# Patient Record
Sex: Male | Born: 1995 | Race: White | Hispanic: No | Marital: Single | Smoking: Never smoker
Health system: Southern US, Community
[De-identification: ages and names within clinical notes are randomized; demographics above are authoritative.]

## PROBLEM LIST (undated history)

## (undated) DIAGNOSIS — T4145XA Adverse effect of unspecified anesthetic, initial encounter: Secondary | ICD-10-CM

## (undated) DIAGNOSIS — F329 Major depressive disorder, single episode, unspecified: Secondary | ICD-10-CM

## (undated) DIAGNOSIS — J329 Chronic sinusitis, unspecified: Secondary | ICD-10-CM

## (undated) DIAGNOSIS — L709 Acne, unspecified: Secondary | ICD-10-CM

## (undated) DIAGNOSIS — Z8489 Family history of other specified conditions: Secondary | ICD-10-CM

## (undated) DIAGNOSIS — D229 Melanocytic nevi, unspecified: Secondary | ICD-10-CM

## (undated) DIAGNOSIS — T8859XA Other complications of anesthesia, initial encounter: Secondary | ICD-10-CM

## (undated) HISTORY — PX: TYMPANOSTOMY TUBE PLACEMENT: SHX32

## (undated) HISTORY — DX: Melanocytic nevi, unspecified: D22.9

## (undated) HISTORY — PX: CYST EXCISION: SHX5701

## (undated) HISTORY — DX: Major depressive disorder, single episode, unspecified: F32.9

## (undated) HISTORY — PX: WISDOM TOOTH EXTRACTION: SHX21

---
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
## 2016-01-03 DIAGNOSIS — B079 Viral wart, unspecified: Secondary | ICD-10-CM | POA: Diagnosis not present

## 2016-02-22 DIAGNOSIS — R509 Fever, unspecified: Secondary | ICD-10-CM | POA: Diagnosis not present

## 2016-02-22 DIAGNOSIS — R51 Headache: Secondary | ICD-10-CM | POA: Diagnosis not present

## 2016-04-06 MED FILL — ADAPALENE-BNZYL PEROX 0.1-2: 0.1-2.5 | 30 days supply | Qty: 45 | Fill #0

## 2016-05-07 DIAGNOSIS — Z23 Encounter for immunization: Secondary | ICD-10-CM | POA: Diagnosis not present

## 2016-08-07 MED FILL — ADAPALENE-BNZYL PEROX 0.1-2: 0.1-2.5 | 30 days supply | Qty: 45 | Fill #1

## 2017-01-04 DIAGNOSIS — Z0000 Encounter for general adult medical examination without abnormal findings: Secondary | ICD-10-CM | POA: Diagnosis not present

## 2017-03-13 MED FILL — EPIDUO FORTE 0.3-2.5% GEL P: 0.3-2.5 | 30 days supply | Qty: 45 | Fill #0

## 2017-06-29 DIAGNOSIS — J329 Chronic sinusitis, unspecified: Secondary | ICD-10-CM

## 2017-06-29 HISTORY — DX: Chronic sinusitis, unspecified: J32.9

## 2017-07-24 ENCOUNTER — Other Ambulatory Visit (HOSPITAL_COMMUNITY): Payer: Self-pay | Admitting: Otolaryngology

## 2017-07-24 DIAGNOSIS — R220 Localized swelling, mass and lump, head: Secondary | ICD-10-CM | POA: Diagnosis not present

## 2017-07-24 DIAGNOSIS — J3089 Other allergic rhinitis: Secondary | ICD-10-CM | POA: Diagnosis not present

## 2017-07-24 DIAGNOSIS — J3489 Other specified disorders of nose and nasal sinuses: Secondary | ICD-10-CM

## 2017-07-24 MED FILL — EPIDUO FORTE 0.3-2.5% GEL P: 0.3-2.5 | 30 days supply | Qty: 45 | Fill #1

## 2017-07-25 ENCOUNTER — Other Ambulatory Visit (HOSPITAL_COMMUNITY): Payer: Self-pay | Admitting: Otolaryngology

## 2017-07-25 ENCOUNTER — Ambulatory Visit (HOSPITAL_COMMUNITY)
Admission: RE | Admit: 2017-07-25 | Discharge: 2017-07-25 | Disposition: A | Discharge status: Home / Self Care | Payer: 59 | Source: Ambulatory Visit | Attending: Otolaryngology | Admitting: Otolaryngology

## 2017-07-25 DIAGNOSIS — J3489 Other specified disorders of nose and nasal sinuses: Secondary | ICD-10-CM

## 2017-07-25 DIAGNOSIS — R220 Localized swelling, mass and lump, head: Principal | ICD-10-CM

## 2017-07-29 ENCOUNTER — Encounter (HOSPITAL_BASED_OUTPATIENT_CLINIC_OR_DEPARTMENT_OTHER): Payer: Self-pay | Admitting: *Deleted

## 2017-07-29 ENCOUNTER — Other Ambulatory Visit: Payer: Self-pay | Admitting: Otolaryngology

## 2017-07-29 ENCOUNTER — Other Ambulatory Visit: Payer: Self-pay

## 2017-07-31 ENCOUNTER — Ambulatory Visit (HOSPITAL_COMMUNITY): Payer: Self-pay

## 2017-08-02 ENCOUNTER — Encounter (HOSPITAL_BASED_OUTPATIENT_CLINIC_OR_DEPARTMENT_OTHER): Payer: Self-pay | Admitting: *Deleted

## 2017-08-02 ENCOUNTER — Ambulatory Visit (HOSPITAL_BASED_OUTPATIENT_CLINIC_OR_DEPARTMENT_OTHER): Payer: 59 | Admitting: Certified Registered"

## 2017-08-02 ENCOUNTER — Other Ambulatory Visit: Payer: Self-pay

## 2017-08-02 ENCOUNTER — Ambulatory Visit (HOSPITAL_BASED_OUTPATIENT_CLINIC_OR_DEPARTMENT_OTHER)
Admission: RE | Admit: 2017-08-02 | Discharge: 2017-08-02 | Disposition: A | Discharge status: Home / Self Care | Payer: 59 | Source: Ambulatory Visit | Attending: Otolaryngology | Admitting: Otolaryngology

## 2017-08-02 ENCOUNTER — Encounter (HOSPITAL_BASED_OUTPATIENT_CLINIC_OR_DEPARTMENT_OTHER)
Admission: RE | Disposition: A | Discharge status: Home / Self Care | Payer: Self-pay | Source: Ambulatory Visit | Attending: Otolaryngology

## 2017-08-02 DIAGNOSIS — L709 Acne, unspecified: Secondary | ICD-10-CM | POA: Insufficient documentation

## 2017-08-02 DIAGNOSIS — M2740 Unspecified cyst of jaw: Secondary | ICD-10-CM | POA: Diagnosis not present

## 2017-08-02 DIAGNOSIS — Z882 Allergy status to sulfonamides status: Secondary | ICD-10-CM | POA: Diagnosis not present

## 2017-08-02 DIAGNOSIS — J322 Chronic ethmoidal sinusitis: Secondary | ICD-10-CM | POA: Diagnosis not present

## 2017-08-02 DIAGNOSIS — J329 Chronic sinusitis, unspecified: Secondary | ICD-10-CM | POA: Diagnosis not present

## 2017-08-02 DIAGNOSIS — J341 Cyst and mucocele of nose and nasal sinus: Secondary | ICD-10-CM | POA: Insufficient documentation

## 2017-08-02 HISTORY — DX: Adverse effect of unspecified anesthetic, initial encounter: T41.45XA

## 2017-08-02 HISTORY — DX: Other complications of anesthesia, initial encounter: T88.59XA

## 2017-08-02 HISTORY — DX: Acne, unspecified: L70.9

## 2017-08-02 HISTORY — DX: Chronic sinusitis, unspecified: J32.9

## 2017-08-02 HISTORY — DX: Family history of other specified conditions: Z84.89

## 2017-08-02 HISTORY — PX: SINUS ENDO W/FUSION: SHX777

## 2017-08-02 SURGERY — SINUS SURGERY, ENDOSCOPIC, USING COMPUTER-ASSISTED NAVIGATION
Anesthesia: General | Site: Nose | Laterality: Bilateral

## 2017-08-02 MED ORDER — PROMETHAZINE HCL 25 MG/ML IJ SOLN: 6.2500 mg | INTRAMUSCULAR | Status: DC | PRN

## 2017-08-02 MED ORDER — LIDOCAINE-EPINEPHRINE 1 %-1:100000 IJ SOLN
INTRAMUSCULAR | Status: AC
  Filled 2017-08-02: qty 1

## 2017-08-02 MED ORDER — ONDANSETRON HCL 4 MG/2ML IJ SOLN
INTRAMUSCULAR | Status: DC | PRN
  Administered 2017-08-02: 4 mg via INTRAVENOUS

## 2017-08-02 MED ORDER — PROPOFOL 10 MG/ML IV BOLUS
INTRAVENOUS | Status: AC
  Filled 2017-08-02: qty 20

## 2017-08-02 MED ORDER — SUGAMMADEX SODIUM 200 MG/2ML IV SOLN
INTRAVENOUS | Status: DC | PRN
  Administered 2017-08-02: 200 mg via INTRAVENOUS

## 2017-08-02 MED ORDER — OXYCODONE HCL 5 MG PO TABS
5.0000 mg | ORAL_TABLET | Freq: Once | ORAL | Status: AC | PRN
  Administered 2017-08-02: 5 mg via ORAL

## 2017-08-02 MED ORDER — SCOPOLAMINE 1 MG/3DAYS TD PT72: 1.0000 | MEDICATED_PATCH | Freq: Once | TRANSDERMAL | Status: DC | PRN

## 2017-08-02 MED ORDER — SUGAMMADEX SODIUM 200 MG/2ML IV SOLN
INTRAVENOUS | Status: AC
  Filled 2017-08-02: qty 2

## 2017-08-02 MED ORDER — BACITRACIN ZINC 500 UNIT/GM EX OINT
TOPICAL_OINTMENT | CUTANEOUS | Status: DC | PRN
  Administered 2017-08-02: 1 via TOPICAL

## 2017-08-02 MED ORDER — PROPOFOL 10 MG/ML IV BOLUS
INTRAVENOUS | Status: DC | PRN
  Administered 2017-08-02: 200 mg via INTRAVENOUS

## 2017-08-02 MED ORDER — FENTANYL CITRATE (PF) 100 MCG/2ML IJ SOLN: 50.0000 ug | INTRAMUSCULAR | Status: DC | PRN

## 2017-08-02 MED ORDER — PHENYLEPHRINE 40 MCG/ML (10ML) SYRINGE FOR IV PUSH (FOR BLOOD PRESSURE SUPPORT)
PREFILLED_SYRINGE | INTRAVENOUS | Status: AC
  Filled 2017-08-02: qty 10

## 2017-08-02 MED ORDER — MIDAZOLAM HCL 5 MG/5ML IJ SOLN
INTRAMUSCULAR | Status: DC | PRN
  Administered 2017-08-02: 2 mg via INTRAVENOUS

## 2017-08-02 MED ORDER — FENTANYL CITRATE (PF) 100 MCG/2ML IJ SOLN
INTRAMUSCULAR | Status: AC
  Filled 2017-08-02: qty 4

## 2017-08-02 MED ORDER — LACTATED RINGERS IV SOLN
INTRAVENOUS | Status: DC
  Administered 2017-08-02 (×2): via INTRAVENOUS

## 2017-08-02 MED ORDER — DEXAMETHASONE SODIUM PHOSPHATE 10 MG/ML IJ SOLN
INTRAMUSCULAR | Status: AC
  Filled 2017-08-02: qty 1

## 2017-08-02 MED ORDER — MIDAZOLAM HCL 2 MG/2ML IJ SOLN
INTRAMUSCULAR | Status: AC
  Filled 2017-08-02: qty 2

## 2017-08-02 MED ORDER — BACITRACIN ZINC 500 UNIT/GM EX OINT
TOPICAL_OINTMENT | CUTANEOUS | Status: AC
  Filled 2017-08-02: qty 28.35

## 2017-08-02 MED ORDER — MIDAZOLAM HCL 2 MG/2ML IJ SOLN: 1.0000 mg | INTRAMUSCULAR | Status: DC | PRN

## 2017-08-02 MED ORDER — PHENYLEPHRINE HCL 10 MG/ML IJ SOLN
INTRAMUSCULAR | Status: DC | PRN
  Administered 2017-08-02: 80 ug via INTRAVENOUS

## 2017-08-02 MED ORDER — ONDANSETRON HCL 4 MG/2ML IJ SOLN
INTRAMUSCULAR | Status: AC
  Filled 2017-08-02: qty 2

## 2017-08-02 MED ORDER — CEPHALEXIN 500 MG PO CAPS: 500.0000 mg | ORAL_CAPSULE | Freq: Three times a day (TID) | ORAL | 0 refills | Status: DC

## 2017-08-02 MED ORDER — ROCURONIUM BROMIDE 10 MG/ML (PF) SYRINGE
PREFILLED_SYRINGE | INTRAVENOUS | Status: AC
  Filled 2017-08-02: qty 5

## 2017-08-02 MED ORDER — LIDOCAINE HCL (CARDIAC) 20 MG/ML IV SOLN
INTRAVENOUS | Status: DC | PRN
  Administered 2017-08-02: 100 mg via INTRAVENOUS

## 2017-08-02 MED ORDER — DEXAMETHASONE SODIUM PHOSPHATE 10 MG/ML IJ SOLN
INTRAMUSCULAR | Status: DC | PRN
  Administered 2017-08-02: 10 mg via INTRAVENOUS

## 2017-08-02 MED ORDER — EPHEDRINE 5 MG/ML INJ
INTRAVENOUS | Status: AC
  Filled 2017-08-02: qty 10

## 2017-08-02 MED ORDER — FENTANYL CITRATE (PF) 100 MCG/2ML IJ SOLN: 25.0000 ug | INTRAMUSCULAR | Status: DC | PRN

## 2017-08-02 MED ORDER — HYDROCODONE-ACETAMINOPHEN 5-325 MG PO TABS: 1.0000 | ORAL_TABLET | Freq: Four times a day (QID) | ORAL | 0 refills | Status: DC | PRN

## 2017-08-02 MED ORDER — OXYCODONE HCL 5 MG/5ML PO SOLN: 5.0000 mg | Freq: Once | ORAL | Status: AC | PRN

## 2017-08-02 MED ORDER — ROCURONIUM BROMIDE 100 MG/10ML IV SOLN
INTRAVENOUS | Status: DC | PRN
  Administered 2017-08-02: 50 mg via INTRAVENOUS
  Administered 2017-08-02: 20 mg via INTRAVENOUS

## 2017-08-02 MED ORDER — LIDOCAINE-EPINEPHRINE 1 %-1:100000 IJ SOLN
INTRAMUSCULAR | Status: DC | PRN
  Administered 2017-08-02: 7 mL

## 2017-08-02 MED ORDER — FENTANYL CITRATE (PF) 100 MCG/2ML IJ SOLN
INTRAMUSCULAR | Status: DC | PRN
  Administered 2017-08-02: 50 ug via INTRAVENOUS
  Administered 2017-08-02: 100 ug via INTRAVENOUS

## 2017-08-02 MED ORDER — OXYMETAZOLINE HCL 0.05 % NA SOLN
NASAL | Status: DC | PRN
  Administered 2017-08-02: 1

## 2017-08-02 MED ORDER — OXYCODONE HCL 5 MG PO TABS
ORAL_TABLET | ORAL | Status: AC
  Filled 2017-08-02: qty 1

## 2017-08-02 MED ORDER — LIDOCAINE 2% (20 MG/ML) 5 ML SYRINGE
INTRAMUSCULAR | Status: AC
  Filled 2017-08-02: qty 10

## 2017-08-02 MED ORDER — OXYMETAZOLINE HCL 0.05 % NA SOLN
NASAL | Status: AC
  Filled 2017-08-02: qty 15

## 2017-08-02 MED FILL — HYDROCODON-APAP 5-325: 5-325 | 8 days supply | Qty: 30 | Fill #0

## 2017-08-02 MED FILL — CEPHALEXIN 500 MG CAPSULE: 500 | 5 days supply | Qty: 15 | Fill #0

## 2017-08-02 SURGICAL SUPPLY — 52 items
ATTRACTOMAT 16X20 MAGNETIC DRP (DRAPES) IMPLANT
BLADE RAD40 ROTATE 4M 4 5PK (BLADE) IMPLANT
BLADE RAD60 ROTATE M4 4 5PK (BLADE) IMPLANT
BLADE ROTATE RAD 12 4 M4 (BLADE) IMPLANT
BLADE ROTATE RAD 40 4 M4 (BLADE) IMPLANT
BLADE ROTATE TRICUT 4X13 M4 (BLADE) ×2 IMPLANT
BLADE TRICUT ROTATE M4 4 5PK (BLADE) IMPLANT
BUR HS RAD FRONTAL 3 (BURR) IMPLANT
CANISTER SUC SOCK COL 7IN (MISCELLANEOUS) ×3 IMPLANT
CANISTER SUCT 1200ML W/VALVE (MISCELLANEOUS) ×2 IMPLANT
COAGULATOR SUCT SWTCH 10FR 6 (ELECTROSURGICAL) IMPLANT
DECANTER SPIKE VIAL GLASS SM (MISCELLANEOUS) ×2 IMPLANT
DRAPE SURG 17X23 STRL (DRAPES) IMPLANT
DRESSING NASAL KENNEDY 3.5X.9 (MISCELLANEOUS) IMPLANT
DRSG NASAL KENNEDY 3.5X.9 (MISCELLANEOUS)
DRSG NASOPORE 8CM (GAUZE/BANDAGES/DRESSINGS) ×1 IMPLANT
DRSG TELFA 3X8 NADH (GAUZE/BANDAGES/DRESSINGS) IMPLANT
ELECT COATED BLADE 2.86 ST (ELECTRODE) IMPLANT
ELECT REM PT RETURN 9FT ADLT (ELECTROSURGICAL) ×2
ELECTRODE REM PT RTRN 9FT ADLT (ELECTROSURGICAL) ×1 IMPLANT
GLOVE SS BIOGEL STRL SZ 7.5 (GLOVE) ×1 IMPLANT
GLOVE SUPERSENSE BIOGEL SZ 7.5 (GLOVE) ×1
GLOVE SURG SS PI 7.0 STRL IVOR (GLOVE) ×1 IMPLANT
GOWN STRL REUS W/ TWL LRG LVL3 (GOWN DISPOSABLE) ×2 IMPLANT
GOWN STRL REUS W/TWL LRG LVL3 (GOWN DISPOSABLE) ×4
IV NS 1000ML (IV SOLUTION)
IV NS 1000ML BAXH (IV SOLUTION) IMPLANT
IV NS 500ML (IV SOLUTION) ×2
IV NS 500ML BAXH (IV SOLUTION) ×1 IMPLANT
NDL PRECISIONGLIDE 27X1.5 (NEEDLE) ×1 IMPLANT
NDL SPNL 25GX3.5 QUINCKE BL (NEEDLE) IMPLANT
NEEDLE PRECISIONGLIDE 27X1.5 (NEEDLE) ×2 IMPLANT
NEEDLE SPNL 25GX3.5 QUINCKE BL (NEEDLE) IMPLANT
NS IRRIG 1000ML POUR BTL (IV SOLUTION) ×2 IMPLANT
PACK BASIN DAY SURGERY FS (CUSTOM PROCEDURE TRAY) ×2 IMPLANT
PACK ENT DAY SURGERY (CUSTOM PROCEDURE TRAY) ×2 IMPLANT
PAD DRESSING TELFA 3X8 NADH (GAUZE/BANDAGES/DRESSINGS) IMPLANT
PATTIES SURGICAL .5 X3 (DISPOSABLE) ×2 IMPLANT
PENCIL FOOT CONTROL (ELECTRODE) IMPLANT
SLEEVE SCD COMPRESS KNEE MED (MISCELLANEOUS) ×2 IMPLANT
SOLUTION ANTI FOG 6CC (MISCELLANEOUS) ×2 IMPLANT
SPONGE GAUZE 2X2 8PLY STRL LF (GAUZE/BANDAGES/DRESSINGS) ×2 IMPLANT
SPONGE SURGIFOAM ABS GEL 12-7 (HEMOSTASIS) IMPLANT
SUT CHROMIC 3 0 PS 2 (SUTURE) IMPLANT
SUT ETHILON 3 0 PS 1 (SUTURE) IMPLANT
TOWEL OR 17X24 6PK STRL BLUE (TOWEL DISPOSABLE) ×2 IMPLANT
TRACKER ENT INSTRUMENT (MISCELLANEOUS) ×2 IMPLANT
TRACKER ENT PATIENT (MISCELLANEOUS) ×2 IMPLANT
TRAY DSU PREP LF (CUSTOM PROCEDURE TRAY) ×2 IMPLANT
TUBE CONNECTING 20X1/4 (TUBING) ×2 IMPLANT
TUBING STRAIGHTSHOT EPS 5PK (TUBING) ×2 IMPLANT
YANKAUER SUCT BULB TIP NO VENT (SUCTIONS) ×2 IMPLANT

## 2017-08-02 NOTE — Op Note (Signed)
Preop/postop diagnosis: Maxillary cysts/masses Procedure: Bilateral max or antrostomy with stripping and anterior ethmoidectomy with fusion guidance Anesthesia: Gen. Estimated blood loss: Less than 10 mL Indications: 22 year old with previous masses in his maxillary sinuses that have enlarged and followed by dentistry with x-rays. They now want to have them removed as they're concerned. CT scan indicates a 2-1/2 cm mass in the right maxillary sinus smaller on the left. We discussed the endoscopic sinus surgery. We discussed risks, benefits, and options. All their questions are answered and consent was obtained. Procedure: Patient was taken to the operating room placed in the supine position after general endotracheal tube anesthesia was placed in the supine position prepped and draped in usual sterile manner. The fusion computer guidance system was positioned calibrated with excellent accuracy. The oxymetazoline pledgets were placed in the nose bilaterally and the inferior and middle turbinates were injected with 1% lidocaine with 1 100,000 epinephrine. The left side was begun making a uncinectomy using the microdebrider and fusion guidance and the uncinate was removed up to the attachment of middle turbinate and down opening up the antrostomy widely. The curved suction was used to rupture the cyst and removed through suction on the left side. The mucosa lining looked good. Anterior ethmoid was opened with the microdebrider and fusion guidance. There was just mild thickened mucosa. A pledget was placed. Right side was then repeated in the same fashion with uncinate removed and antrostomy opened widely. This was done with the microdebrider. The cyst was then visualized with the 30 scope and opened. There was a clear serous fluid. An alligator forcep was used to remove the cyst and mucosa. Microdebrider was then used to open the anterior ethmoid which had slight thickening of the tissue. Pledget was placed. The  nasopharynx was suctioned out of all blood and debris. Nasal pore soaked in Bactroban was placed into the nose ethmoid cavities bilaterally. There is good hemostasis. The patient was awakened brought to recovery in stable condition counts correct

## 2017-08-02 NOTE — H&P (Signed)
Richard Dominguez is an 22 y.o. male.   Chief Complaint: sinusitis HPI: hx of cyst/mass in the sinus and ready to remove  Past Medical History:  Diagnosis Date  . Acne   . Chronic sinusitis 06/2017  . Complication of anesthesia    was told that he became "obtunded" when his neck was hyperextended during wisdom teeth extraction, per mother  . Family history of adverse reaction to anesthesia    pt's father wakes up angry    Past Surgical History:  Procedure Laterality Date  . CYST EXCISION     lower lip  . TYMPANOSTOMY TUBE PLACEMENT Bilateral   . WISDOM TOOTH EXTRACTION      Family History  Problem Relation Age of Onset  . Anesthesia problems Father        wakes up angry   Social History:  reports that  has never smoked. he has never used smokeless tobacco. He reports that he drinks alcohol. He reports that he does not use drugs.  Allergies:  Allergies  Allergen Reactions  . Sulfa Antibiotics Rash    Medications Prior to Admission  Medication Sig Dispense Refill  . Adapalene-Benzoyl Peroxide (EPIDUO) 0.1-2.5 % gel Apply topically at bedtime.      No results found for this or any previous visit (from the past 48 hour(s)). No results found.  Review of Systems  Constitutional: Negative.   HENT: Negative.   Eyes: Negative.   Respiratory: Negative.   Cardiovascular: Negative.   Skin: Negative.     Blood pressure 118/71, pulse 63, temperature (!) 97.5 F (36.4 C), temperature source Oral, resp. rate 15, height 6\' 4"  (1.93 m), weight 63.9 kg (140 lb 12.8 oz), SpO2 100 %. Physical Exam  Constitutional: He appears well-developed and well-nourished.  HENT:  Head: Normocephalic and atraumatic.  Mouth/Throat: Oropharynx is clear and moist.  Eyes: Conjunctivae are normal. Pupils are equal, round, and reactive to light.  Neck: Normal range of motion. Neck supple.  Cardiovascular: Normal rate.  Respiratory: Effort normal.  GI: Soft.  Musculoskeletal: Normal range of  motion.     Assessment/Plan Chronic sinusitis- discussed ESS and ready to proceed  Melissa Montane, MD 08/02/2017, 10:39 AM

## 2017-08-02 NOTE — Transfer of Care (Signed)
Immediate Anesthesia Transfer of Care Note  Patient: Richard Dominguez  Procedure(s) Performed: Endoscopic sinus surgery with maxillary antrostomy with stripping and anterior ethmoidectomy (Bilateral Nose)  Patient Location: PACU  Anesthesia Type:General  Level of Consciousness: awake, alert  and oriented  Airway & Oxygen Therapy: Patient Spontanous Breathing and Patient connected to face mask oxygen  Post-op Assessment: Report given to RN and Post -op Vital signs reviewed and stable  Post vital signs: Reviewed and stable  Last Vitals:  Vitals:   08/02/17 0944  BP: 118/71  Pulse: 63  Resp: 15  Temp: (!) 36.4 C  SpO2: 100%    Last Pain:  Vitals:   08/02/17 0944  TempSrc: Oral         Complications: No apparent anesthesia complications

## 2017-08-02 NOTE — Discharge Instructions (Signed)

## 2017-08-02 NOTE — Anesthesia Postprocedure Evaluation (Signed)
Anesthesia Post Note  Patient: Richard Dominguez  Procedure(s) Performed: Endoscopic sinus surgery with maxillary antrostomy with stripping and anterior ethmoidectomy (Bilateral Nose)     Patient location during evaluation: PACU Anesthesia Type: General Level of consciousness: awake and alert Pain management: pain level controlled Vital Signs Assessment: post-procedure vital signs reviewed and stable Respiratory status: spontaneous breathing, nonlabored ventilation and respiratory function stable Cardiovascular status: blood pressure returned to baseline and stable Postop Assessment: no apparent nausea or vomiting Anesthetic complications: no    Last Vitals:  Vitals:   08/02/17 1215 08/02/17 1230  BP: 123/79 130/86  Pulse: 72 81  Resp: 10 13  Temp:    SpO2: 99% 98%    Last Pain:  Vitals:   08/02/17 1230  TempSrc:   PainSc: 2                  Audry Pili

## 2017-08-02 NOTE — Anesthesia Preprocedure Evaluation (Signed)
Anesthesia Evaluation  Patient identified by MRN, date of birth, ID band Patient awake    Reviewed: Allergy & Precautions, NPO status , Patient's Chart, lab work & pertinent test results  Airway Mallampati: II  TM Distance: >3 FB Neck ROM: Full    Dental  (+) Dental Advisory Given   Pulmonary neg pulmonary ROS,    Pulmonary exam normal breath sounds clear to auscultation       Cardiovascular negative cardio ROS Normal cardiovascular exam Rhythm:Regular Rate:Normal     Neuro/Psych negative neurological ROS  negative psych ROS   GI/Hepatic negative GI ROS, Neg liver ROS,   Endo/Other  negative endocrine ROS  Renal/GU negative Renal ROS  negative genitourinary   Musculoskeletal negative musculoskeletal ROS (+)   Abdominal   Peds  Hematology negative hematology ROS (+)   Anesthesia Other Findings   Reproductive/Obstetrics                             Anesthesia Physical Anesthesia Plan  ASA: I  Anesthesia Plan: General   Post-op Pain Management:    Induction: Intravenous  PONV Risk Score and Plan: 3 and Treatment may vary due to age or medical condition, Ondansetron, Dexamethasone and Midazolam  Airway Management Planned: Oral ETT  Additional Equipment: None  Intra-op Plan:   Post-operative Plan: Extubation in OR  Informed Consent: I have reviewed the patients History and Physical, chart, labs and discussed the procedure including the risks, benefits and alternatives for the proposed anesthesia with the patient or authorized representative who has indicated his/her understanding and acceptance.   Dental advisory given  Plan Discussed with: CRNA  Anesthesia Plan Comments:         Anesthesia Quick Evaluation

## 2017-08-05 ENCOUNTER — Encounter (HOSPITAL_BASED_OUTPATIENT_CLINIC_OR_DEPARTMENT_OTHER): Payer: Self-pay | Admitting: Otolaryngology

## 2017-08-09 DIAGNOSIS — J324 Chronic pansinusitis: Secondary | ICD-10-CM | POA: Insufficient documentation

## 2017-08-16 DIAGNOSIS — J324 Chronic pansinusitis: Secondary | ICD-10-CM | POA: Diagnosis not present

## 2017-09-20 MED FILL — EPIDUO FORTE 0.3-2.5% GEL P: 0.3-2.5 | 30 days supply | Qty: 45 | Fill #2

## 2017-11-13 MED FILL — EPIDUO FORTE 0.3-2.5% GEL P: 0.3-2.5 | 30 days supply | Qty: 45 | Fill #3

## 2017-11-23 DIAGNOSIS — R51 Headache: Secondary | ICD-10-CM | POA: Diagnosis not present

## 2017-11-23 DIAGNOSIS — R509 Fever, unspecified: Secondary | ICD-10-CM | POA: Diagnosis not present

## 2017-11-23 DIAGNOSIS — R52 Pain, unspecified: Secondary | ICD-10-CM | POA: Diagnosis not present

## 2017-11-23 DIAGNOSIS — R05 Cough: Secondary | ICD-10-CM | POA: Diagnosis not present

## 2017-11-23 DIAGNOSIS — M791 Myalgia, unspecified site: Secondary | ICD-10-CM | POA: Diagnosis not present

## 2017-11-24 DIAGNOSIS — R509 Fever, unspecified: Secondary | ICD-10-CM | POA: Diagnosis not present

## 2017-12-06 DIAGNOSIS — H6123 Impacted cerumen, bilateral: Secondary | ICD-10-CM | POA: Insufficient documentation

## 2017-12-19 ENCOUNTER — Ambulatory Visit (INDEPENDENT_AMBULATORY_CARE_PROVIDER_SITE_OTHER): Payer: Self-pay | Admitting: Nurse Practitioner

## 2017-12-19 VITALS — BP 118/78 | HR 79 | Temp 98.3°F | Resp 20 | Ht 77.5 in | Wt 144.6 lb

## 2017-12-19 DIAGNOSIS — Z0000 Encounter for general adult medical examination without abnormal findings: Secondary | ICD-10-CM

## 2017-12-19 NOTE — Patient Instructions (Signed)

## 2017-12-19 NOTE — Progress Notes (Signed)
Subjective:  Richard Dominguez is a 22 y.o. male who presents for basic physical exam for summer camp.  Patient denies any current health related concerns.  Patient does have a history of allergies to sulfa medications.  Patient denies any other medical history other than asthma and chronic sinusitis.  Patient takes Zyrtec as needed for seasonal allergies.  Patient's  immunizations are up-to-date with the exception of the second dose of the meningococcal B vaccine.  Past Medical History:  Diagnosis Date  . Acne   . Chronic sinusitis 06/2017  . Complication of anesthesia    was told that he became "obtunded" when his neck was hyperextended during wisdom teeth extraction, per mother  . Family history of adverse reaction to anesthesia    pt's father wakes up angry    Past Surgical History:  Procedure Laterality Date  . CYST EXCISION     lower lip  . SINUS ENDO W/FUSION Bilateral 08/02/2017   Procedure: Endoscopic sinus surgery with maxillary antrostomy with stripping and anterior ethmoidectomy;  Surgeon: Melissa Montane, MD;  Location: [facility];  Service: ENT;  Laterality: Bilateral;  Endoscopic sinus surgery with maxillary antrostomy with stripping and anterior ethmoidectomy  . TYMPANOSTOMY TUBE PLACEMENT Bilateral   . WISDOM TOOTH EXTRACTION      Social History   Tobacco Use  . Smoking status: Never Smoker  . Smokeless tobacco: Never Used  Substance Use Topics  . Alcohol use: Yes    Comment: occasionally  . Drug use: No    Allergies  Allergen Reactions  . Sulfa Antibiotics Rash    Current Outpatient Medications  Medication Sig Dispense Refill  . Adapalene-Benzoyl Peroxide (EPIDUO) 0.1-2.5 % gel Apply topically at bedtime.    . cephALEXin (KEFLEX) 500 MG capsule Take 1 capsule (500 mg total) by mouth 3 (three) times daily. (Patient not taking: Reported on 12/19/2017) 15 capsule 0  . HYDROcodone-acetaminophen (LORTAB) 5-325 MG tablet Take 1 tablet by mouth every 6  (six) hours as needed for moderate pain. (Patient not taking: Reported on 12/19/2017) 30 tablet 0   No current facility-administered medications for this visit.     Review of Systems  Constitutional: Negative.   HENT: Negative.   Eyes: Negative.   Respiratory: Negative.   Cardiovascular: Negative.   Gastrointestinal: Negative.   Genitourinary: Negative.   Musculoskeletal: Negative.   Skin: Negative.   Neurological: Negative.   Endo/Heme/Allergies: Positive for environmental allergies.  Psychiatric/Behavioral: Negative.     Objective:  BP 118/78 (BP Location: Right Arm, Patient Position: Sitting, Cuff Size: Normal)   Pulse 79   Temp 98.3 F (36.8 C) (Oral)   Resp 20   Ht 6' 5.5" (1.969 m)   Wt 144 lb 9.6 oz (65.6 kg)   SpO2 96%   BMI 16.93 kg/m   General Appearance:  Alert, cooperative, no distress, appears stated age  Head:  Normocephalic, without obvious abnormality, atraumatic  Eyes:  PERRL, conjunctiva/corneas clear, EOM's intact, fundi benign, both eyes  Ears:  Normal TM's and external ear canals, both ears  Nose: Nares normal, septum midline, mucosa normal, no drainage or sinus tenderness  Throat: Lips, mucosa, and tongue normal; teeth and gums normal  Neck: Supple, symmetrical, trachea midline, no adenopathy, thyroid: not enlarged, symmetric, no tenderness/mass/nodules, no carotid bruit or JVD  Back:   Symmetric, no curvature, ROM normal, no CVA tenderness  Lungs:   Clear to auscultation bilaterally, respirations unlabored  Chest Wall:  No tenderness or deformity  Heart:  Regular  rate and rhythm, S1, S2 normal, no murmur, rub or gallop  Abdomen:   Soft, non-tender, bowel sounds active all four quadrants,  no masses, no organomegaly  Extremities: Extremities normal, atraumatic, no cyanosis or edema  Pulses: 2+ and symmetric  Skin: Skin color, texture, turgor normal, no rashes or lesions  Lymph nodes: Cervical and submandibular nodes normal  Neurologic: Normal       Assessment:  basic physical exam    Plan:  Patient education provided.  No labs needed at this time.  Patient will follow up with PCP.

## 2018-01-01 MED FILL — EPIDUO FORTE 0.3-2.5% GEL P: 0.3-2.5 | 30 days supply | Qty: 45 | Fill #4

## 2018-02-27 MED FILL — EPIDUO FORTE 0.3-2.5% GEL P: 0.3-2.5 | 30 days supply | Qty: 45 | Fill #5

## 2018-04-02 MED FILL — EPIDUO FORTE 0.3-2.5% GEL P: 0.3-2.5 | 30 days supply | Qty: 45 | Fill #0

## 2018-05-03 DIAGNOSIS — N442 Benign cyst of testis: Secondary | ICD-10-CM | POA: Diagnosis not present

## 2018-05-29 MED FILL — EPIDUO FORTE 0.3-2.5% GEL P: 0.3-2.5 | 30 days supply | Qty: 45 | Fill #1

## 2018-06-23 DIAGNOSIS — H6123 Impacted cerumen, bilateral: Secondary | ICD-10-CM | POA: Diagnosis not present

## 2018-06-27 ENCOUNTER — Ambulatory Visit (INDEPENDENT_AMBULATORY_CARE_PROVIDER_SITE_OTHER): Payer: Self-pay | Admitting: Family

## 2018-06-27 DIAGNOSIS — Z23 Encounter for immunization: Secondary | ICD-10-CM

## 2018-06-27 NOTE — Patient Instructions (Signed)

## 2018-07-03 MED FILL — EPIDUO FORTE 0.3-2.5% GEL P: 0.3-2.5 | 30 days supply | Qty: 45 | Fill #2

## 2018-07-15 DIAGNOSIS — L709 Acne, unspecified: Secondary | ICD-10-CM | POA: Diagnosis not present

## 2018-07-15 DIAGNOSIS — L309 Dermatitis, unspecified: Secondary | ICD-10-CM | POA: Diagnosis not present

## 2018-07-15 DIAGNOSIS — D229 Melanocytic nevi, unspecified: Secondary | ICD-10-CM | POA: Diagnosis not present

## 2018-07-17 MED FILL — TRIAMCINOLONE 0.1% CREAM: 0.1 | 30 days supply | Qty: 80 | Fill #0

## 2018-07-18 ENCOUNTER — Ambulatory Visit: Payer: 59 | Admitting: Podiatry

## 2018-07-18 ENCOUNTER — Encounter: Payer: Self-pay | Admitting: Podiatry

## 2018-07-18 DIAGNOSIS — L600 Ingrowing nail: Secondary | ICD-10-CM | POA: Diagnosis not present

## 2018-07-18 MED ORDER — NEOMYCIN-POLYMYXIN-HC 3.5-10000-1 OT SOLN: OTIC | 0 refills | Status: DC

## 2018-07-18 MED FILL — NEO/POLYMYXIN/HC EAR SOLN: 3.5-10000-1 | 30 days supply | Qty: 10 | Fill #0

## 2018-07-18 NOTE — Patient Instructions (Signed)

## 2018-07-19 NOTE — Progress Notes (Signed)
Subjective:   Patient ID: Richard Dominguez, male   DOB: 22 y.o.   MRN: [id number]   HPI Patient states I had ingrown toenails removed a number of years ago which is done well but I developed a spicule on the left big toe that sore and I have to work on it and it times it gets quite irritated and hard to get out   ROS      Objective:  Physical Exam  Neurovascular status intact with patient having good process with chronic ingrown toenail correction several years ago but does have incurvation of the medial border with spicule formation left hallux     Assessment:  Ingrown toenail deformity left hallux that is painful with spicule formation with the right showing irritation but no indication of a spicule formation     Plan:  H&P condition reviewed and recommended removal of the left spicule.  Explained procedure risk patient signed consent form I infiltrated 60 mg like Marcaine mixture removed spicule under sterile conditions with sterile instrumentation and applied phenol to the base 3 applications 30 seconds followed by alcohol lavage and sterile dressing.  Gave instructions on soaks and reappoint and was encouraged to call with questions

## 2018-08-06 MED FILL — EPIDUO FORTE 0.3-2.5% GEL P: 0.3-2.5 | 30 days supply | Qty: 45 | Fill #0

## 2018-09-24 MED FILL — EPIDUO FORTE 0.3-2.5% GEL P: 0.3-2.5 | 30 days supply | Qty: 45 | Fill #1

## 2018-12-30 MED FILL — EPIDUO FORTE 0.3-2.5% GEL P: 0.3-2.5 | 30 days supply | Qty: 45 | Fill #2

## 2019-02-03 DIAGNOSIS — H5203 Hypermetropia, bilateral: Secondary | ICD-10-CM | POA: Diagnosis not present

## 2019-02-11 DIAGNOSIS — H6123 Impacted cerumen, bilateral: Secondary | ICD-10-CM | POA: Diagnosis not present

## 2019-02-20 ENCOUNTER — Other Ambulatory Visit: Payer: Self-pay

## 2019-02-20 DIAGNOSIS — R6889 Other general symptoms and signs: Secondary | ICD-10-CM | POA: Diagnosis not present

## 2019-02-20 DIAGNOSIS — Z20822 Contact with and (suspected) exposure to covid-19: Secondary | ICD-10-CM

## 2019-02-23 LAB — NOVEL CORONAVIRUS, NAA: SARS-CoV-2, NAA: NOT DETECTED

## 2019-02-25 MED FILL — EPIDUO FORTE 0.3-2.5% GEL P: 0.3-2.5 | 30 days supply | Qty: 45 | Fill #3

## 2019-03-23 MED FILL — EPIDUO FORTE 0.3-2.5% GEL P: 0.3-2.5 | 30 days supply | Qty: 45 | Fill #4

## 2019-03-26 DIAGNOSIS — Z13220 Encounter for screening for lipoid disorders: Secondary | ICD-10-CM | POA: Diagnosis not present

## 2019-03-26 DIAGNOSIS — Z23 Encounter for immunization: Secondary | ICD-10-CM | POA: Diagnosis not present

## 2019-03-26 DIAGNOSIS — Z0000 Encounter for general adult medical examination without abnormal findings: Secondary | ICD-10-CM | POA: Diagnosis not present

## 2019-06-15 MED FILL — EPIDUO FORTE 0.3-2.5% GEL P: 0.3-2.5 | 30 days supply | Qty: 45 | Fill #5

## 2019-07-06 IMAGING — CT CT STEALTH SINUS W/O CONTRAST
3 series · 10 of 47 positions shown, 11 images · non-contrast
Comparison: None.

CLINICAL DATA: Maxillary sinus mass.  Congestion.

EXAM:
CT STEALTH SINUS W/O CONTRAST
TECHNIQUE: Multidetector CT images of the paranasal sinuses were obtained using
the standard protocol without intravenous contrast.

[Series 2: standard · axial · 0.38mm/px · z∈[-160,-77]mm · 4 of 115 slices shown, 5 images]
[im 16/115  brain]
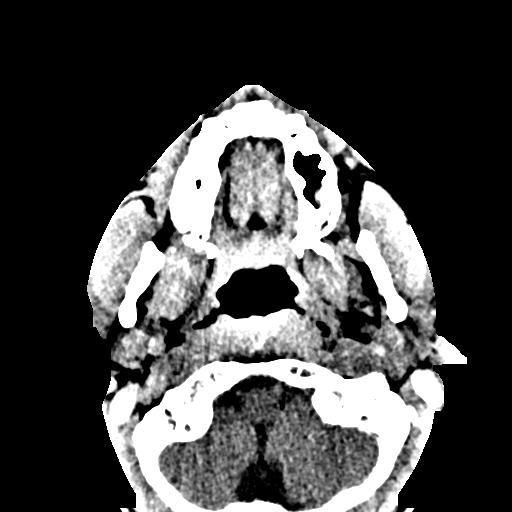
[im 16/115  bone]
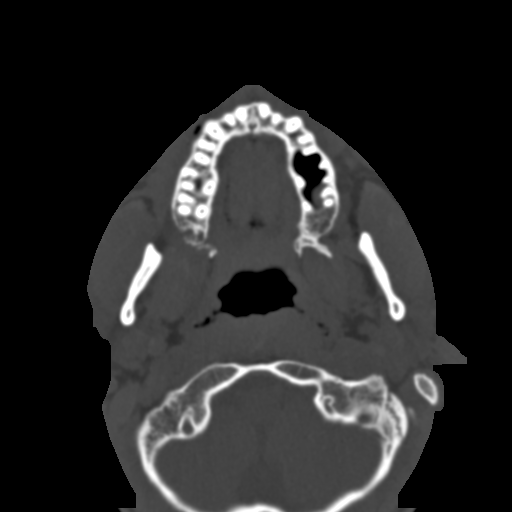
[im 44/115  bone]
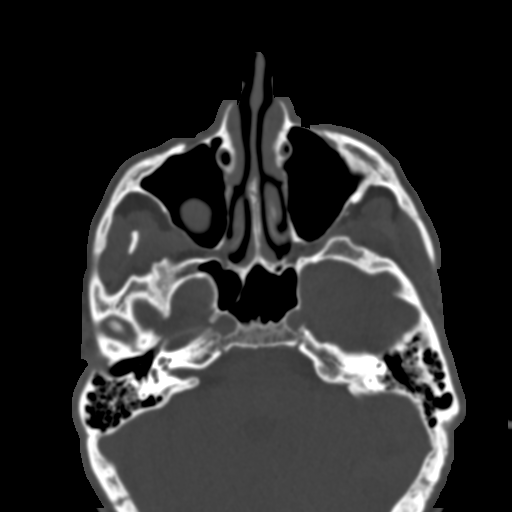
[im 71/115  bone]
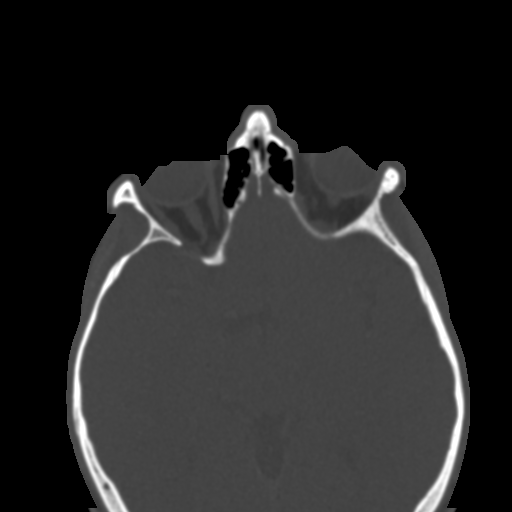
[im 99/115  bone]
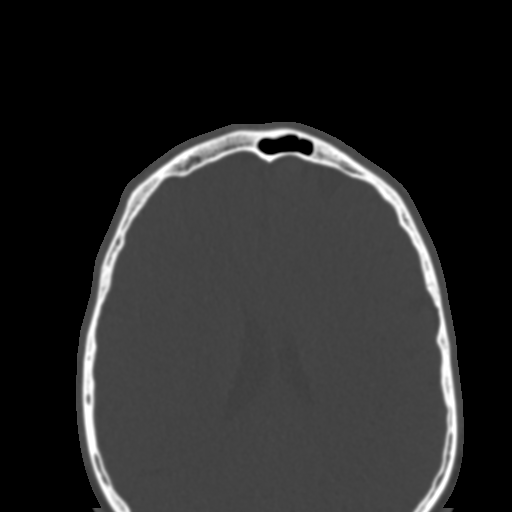

[Series 4: coronal · coronal · 0.22mm/px · 3 of 90 slices shown]
[im 30/90  bone]
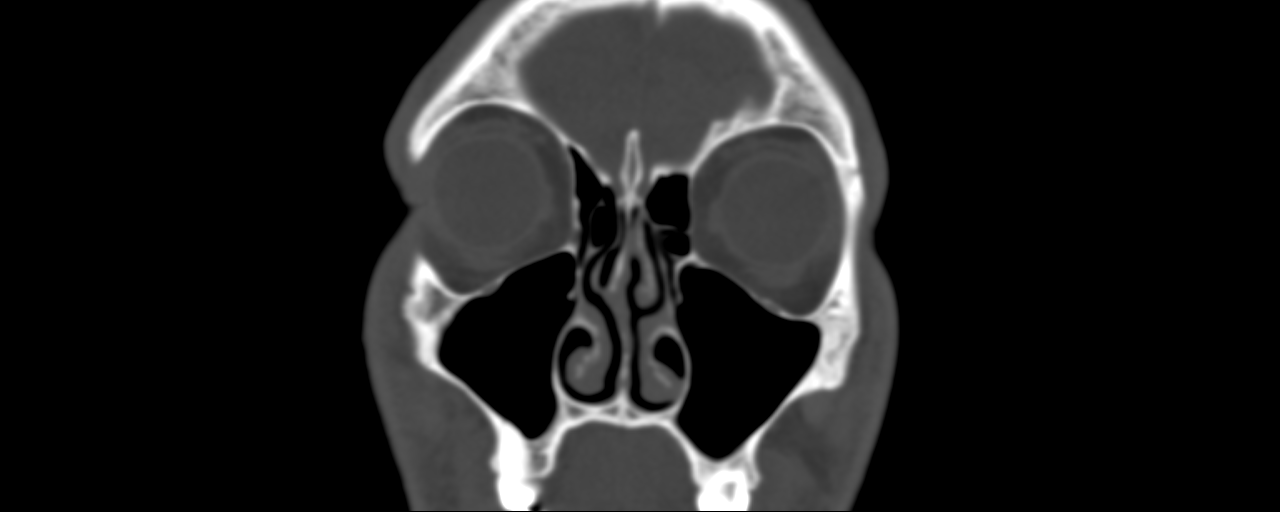
[im 40/90  bone]
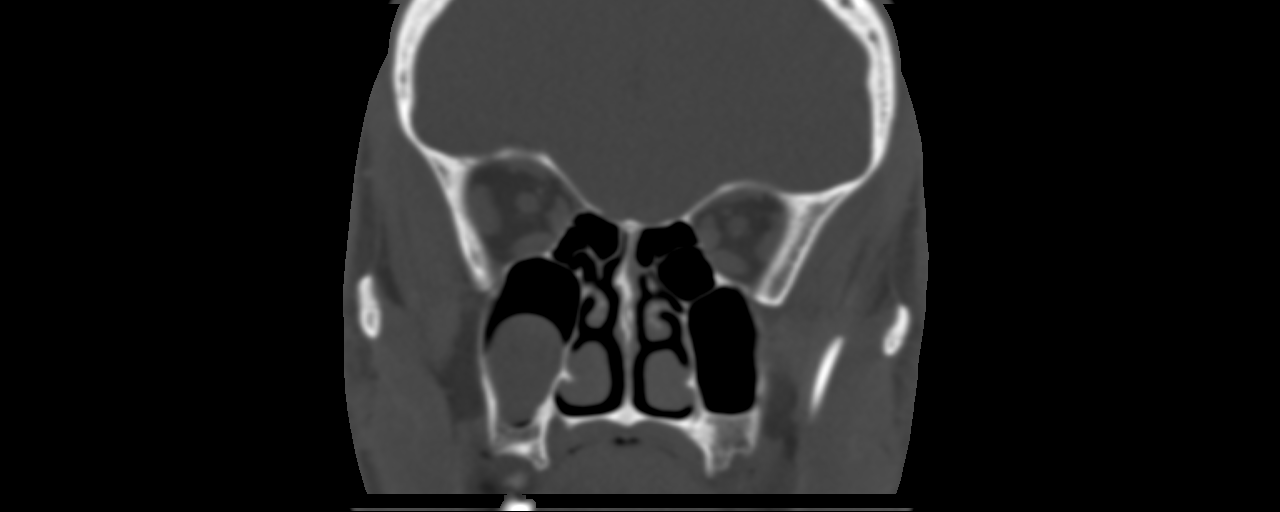
[im 50/90  bone]
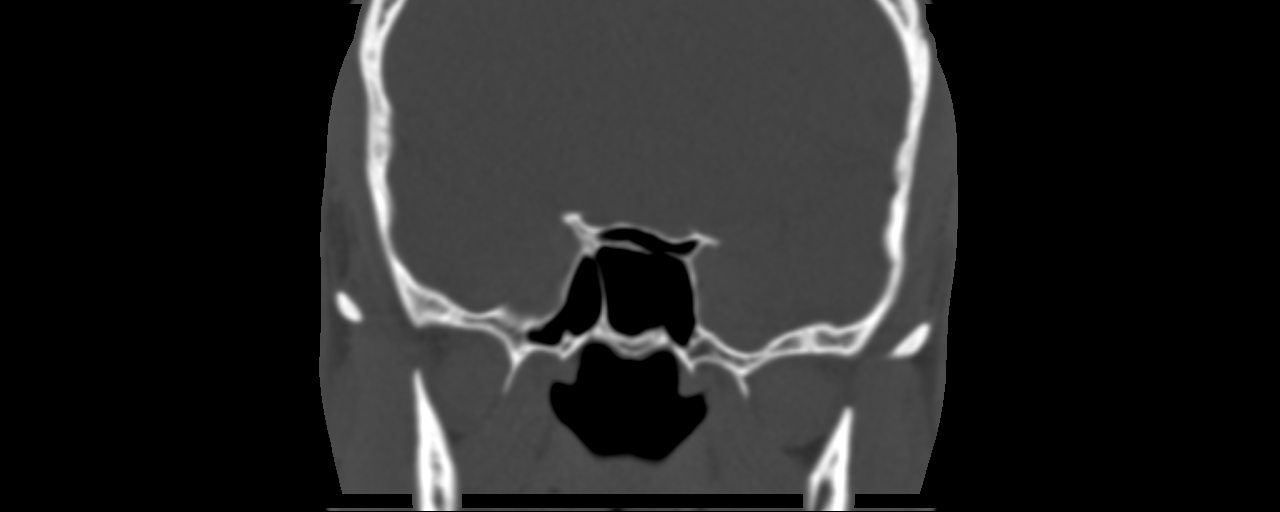

[Series 5: sagittal · sagittal · 0.22mm/px · 3 of 102 slices shown]
[im 34/102  bone]
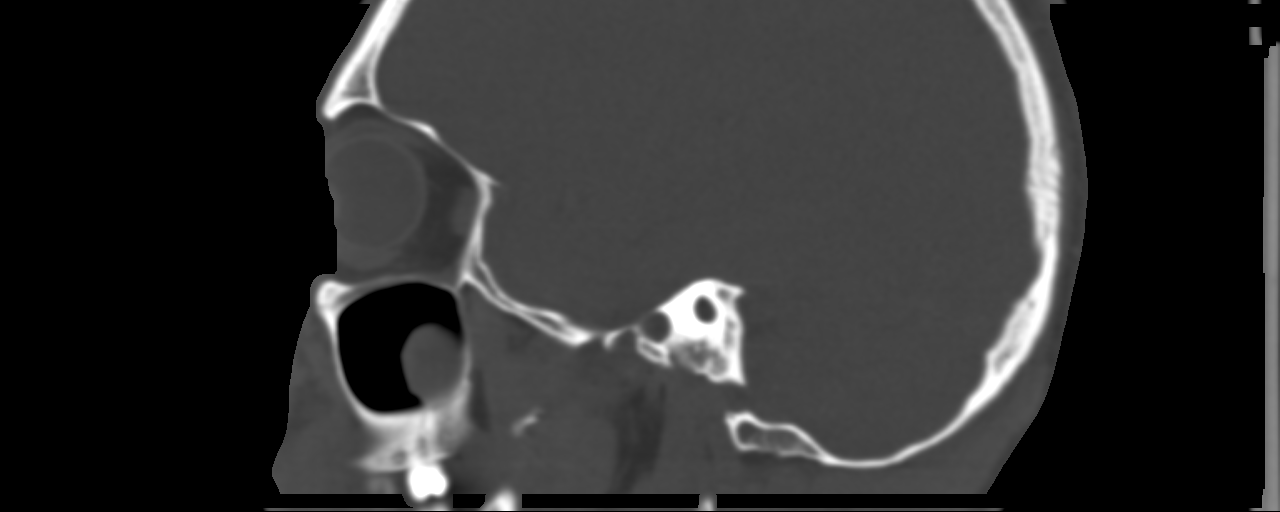
[im 51/102  bone]
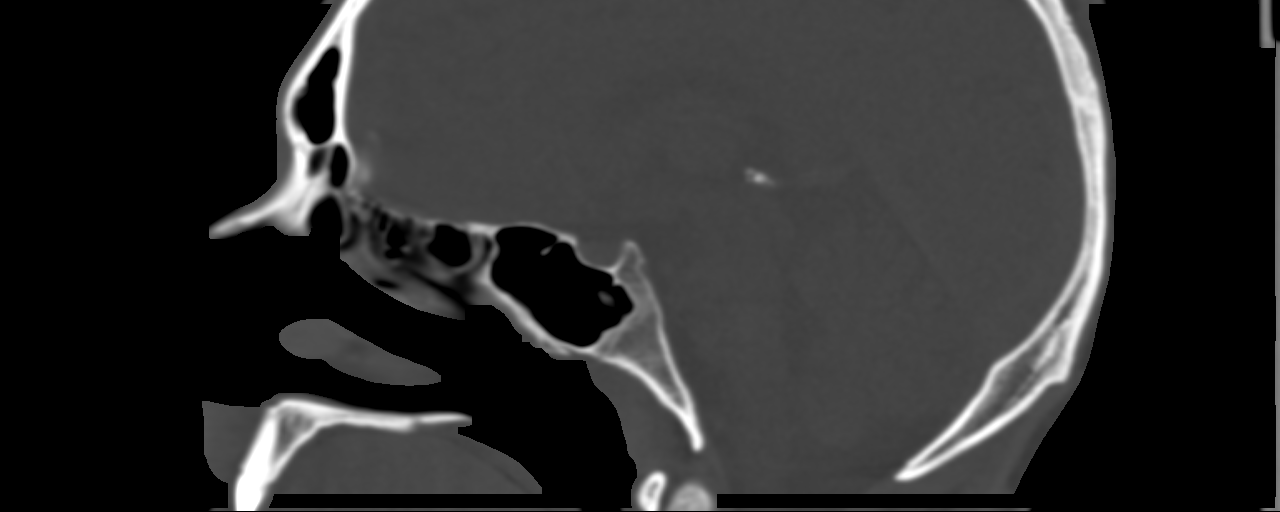
[im 68/102  bone]
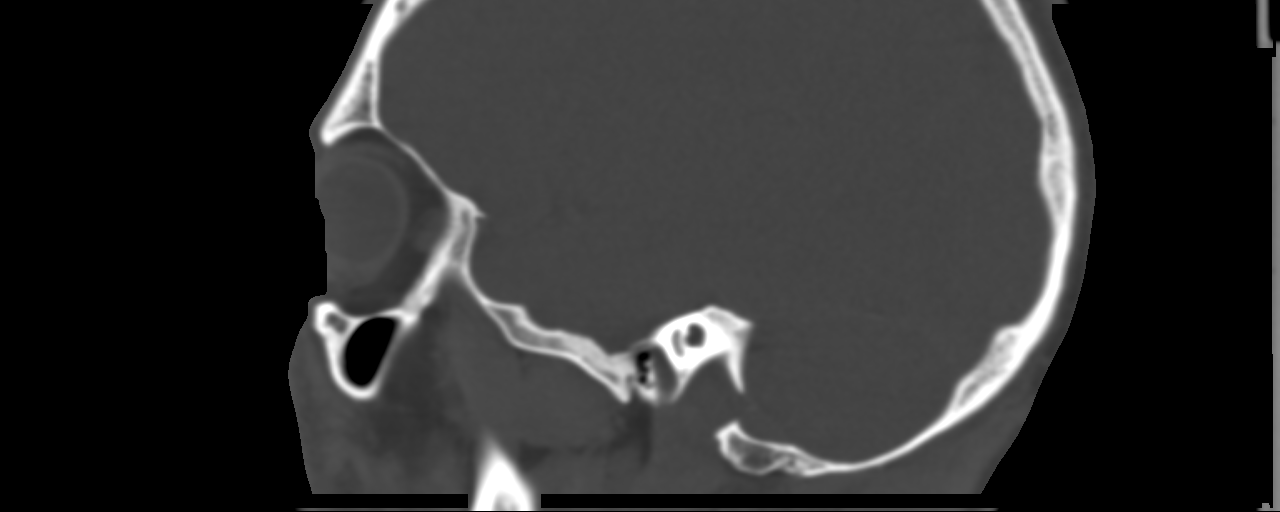

[10 of 47 positions shown; findings below may reference images not displayed]

FINDINGS: Paranasal sinuses:

Frontal: Normally aerated. Patent frontal sinus drainage pathways.

Ethmoid: Normally aerated.

Maxillary: A well-defined rounded soft tissue mass is present in the
inferior right maxillary sinus. This appears to be attached to the
lateral wall, likely reflecting a 2.3 cm polyp. Smaller rounded soft
tissue densities are noted anteriorly and inferiorly in the left
maxillary sinus, likely reflecting small polyps or mucous retention
cysts. There is no other significant mucosal disease. No fluid
levels are present.

Sphenoid: Normally aerated. Patent sphenoethmoidal recesses.

Right ostiomeatal unit: Narrowed but patent.

Left ostiomeatal unit: The narrowed but patent.

Nasal passages: Patent. Intact nasal septum is midline.

Anatomy: No pneumatization superior to anterior ethmoid notches.
Olfactory grooves are asymmetric, deeper on the left measuring
mm compared with 6 mm on the right. Sellar sphenoid pneumatization
pattern. No dehiscence of carotid or optic canals. No onodi cell.

Other: Orbits and intracranial compartment are unremarkable. Visible
mastoid air cells are normally aerated.
IMPRESSION: 1. 2.3 cm polyp in the right maxillary sinus appears benign.
2. Smaller polyps or mucous retention cysts of the lateral inferior
left maxillary sinus.
3. No other focal or active paranasal sinus disease.
4. There is narrowing of the ostiomeatal complex bilaterally, worse
on the right. No obstruction is present.

## 2019-07-28 DIAGNOSIS — L709 Acne, unspecified: Secondary | ICD-10-CM | POA: Diagnosis not present

## 2019-07-28 DIAGNOSIS — D229 Melanocytic nevi, unspecified: Secondary | ICD-10-CM | POA: Diagnosis not present

## 2019-07-30 MED FILL — EPIDUO FORTE 0.3-2.5% GEL P: 0.3-2.5 | 30 days supply | Qty: 45 | Fill #0

## 2019-07-30 MED FILL — MINOCYCLINE 100 MG CAPSULE: 100 | 30 days supply | Qty: 60 | Fill #0

## 2019-08-05 DIAGNOSIS — H6123 Impacted cerumen, bilateral: Secondary | ICD-10-CM | POA: Diagnosis not present

## 2019-08-13 DIAGNOSIS — F3341 Major depressive disorder, recurrent, in partial remission: Secondary | ICD-10-CM | POA: Diagnosis not present

## 2019-08-13 DIAGNOSIS — F411 Generalized anxiety disorder: Secondary | ICD-10-CM | POA: Diagnosis not present

## 2019-09-11 DIAGNOSIS — F411 Generalized anxiety disorder: Secondary | ICD-10-CM | POA: Diagnosis not present

## 2019-09-11 DIAGNOSIS — F3341 Major depressive disorder, recurrent, in partial remission: Secondary | ICD-10-CM | POA: Diagnosis not present

## 2019-09-15 MED FILL — MINOCYCLINE 100 MG CAPSULE: 100 | 30 days supply | Qty: 60 | Fill #1

## 2019-09-15 MED FILL — EPIDUO FORTE 0.3-2.5% GEL P: 0.3-2.5 | 30 days supply | Qty: 45 | Fill #1

## 2019-09-15 MED FILL — ESCITALOPRAM 10 MG TABLET: 10 | 90 days supply | Qty: 90 | Fill #0

## 2019-11-04 MED FILL — MINOCYCLINE 100 MG CAPSULE: 100 | 30 days supply | Qty: 60 | Fill #2

## 2019-11-04 MED FILL — EPIDUO FORTE 0.3-2.5% GEL P: 0.3-2.5 | 30 days supply | Qty: 45 | Fill #2

## 2019-11-05 ENCOUNTER — Other Ambulatory Visit (HOSPITAL_COMMUNITY): Payer: Self-pay | Admitting: Family Medicine

## 2019-11-05 DIAGNOSIS — F411 Generalized anxiety disorder: Secondary | ICD-10-CM | POA: Diagnosis not present

## 2019-11-05 DIAGNOSIS — F3341 Major depressive disorder, recurrent, in partial remission: Secondary | ICD-10-CM | POA: Diagnosis not present

## 2019-11-14 ENCOUNTER — Ambulatory Visit: Payer: 59 | Attending: Internal Medicine

## 2019-11-14 DIAGNOSIS — Z23 Encounter for immunization: Secondary | ICD-10-CM

## 2019-11-14 NOTE — Progress Notes (Signed)
   Covid-19 Vaccination Clinic  Name:  Richard Dominguez    MRN: [id number] DOB: 1995-11-02  11/14/2019  [name] was observed post Covid-19 immunization for 15 minutes without incident. He was provided with Vaccine Information Sheet and instruction to access the V-Safe system.   [name] was instructed to call 911 with any severe reactions post vaccine: Marland Kitchen Difficulty breathing  . Swelling of face and throat  . A fast heartbeat  . A bad rash all over body  . Dizziness and weakness   Immunizations Administered    Name Date Dose VIS Date Route   Pfizer COVID-19 Vaccine 11/14/2019  4:39 PM 0.3 mL 07/10/2019 Intramuscular   Manufacturer: [place]   Lot: [id number]   [place]: [id number]

## 2019-12-07 ENCOUNTER — Ambulatory Visit: Payer: 59 | Attending: Internal Medicine

## 2019-12-07 DIAGNOSIS — Z23 Encounter for immunization: Secondary | ICD-10-CM

## 2019-12-07 NOTE — Progress Notes (Signed)
   Covid-19 Vaccination Clinic  Name:  Richard Dominguez    MRN: [id number] DOB: 11-01-1995  12/07/2019  [name] was observed post Covid-19 immunization for 15 minutes without incident. He was provided with Vaccine Information Sheet and instruction to access the V-Safe system.   [name] was instructed to call 911 with any severe reactions post vaccine: Marland Kitchen Difficulty breathing  . Swelling of face and throat  . A fast heartbeat  . A bad rash all over body  . Dizziness and weakness   Immunizations Administered    Name Date Dose VIS Date Route   Pfizer COVID-19 Vaccine 12/07/2019  3:43 PM 0.3 mL 09/23/2018 Intramuscular   Manufacturer: [place]   Lot: [id number]   [place]: [place] [facility]  Name:  Richard Dominguez    MRN: [id number] DOB: [birth year]  12/07/2019  [name] was observed post Covid-19 immunization for 15 minutes    Covid-19 Vaccination Clinic  Name:  Richard Dominguez    MRN: [id number] DOB: 1996-03-21  12/07/2019  [name] was observed post Covid-19 immunization for 15 minutes without incident. He was provided with Vaccine Information Sheet and instruction to access the V-Safe system.   [name] was instructed to call 911 with any severe reactions post vaccine: Marland Kitchen Difficulty breathing  . Swelling of face and throat  . A fast heartbeat  . A bad rash all over body  . Dizziness and weakness   Immunizations Administered    Name Date Dose VIS Date Route   Pfizer COVID-19 Vaccine 12/07/2019  3:43 PM 0.3 mL 09/23/2018 Intramuscular   Manufacturer: [employer]   Lot: [id number]   [place]: [id number]     without incident. He was provided with Vaccine Information Sheet and instruction to access the V-Safe system.   [name] was instructed to call 911 with any severe reactions post vaccine: Marland Kitchen Difficulty breathing  . Swelling of face and throat  . A fast heartbeat  . A bad rash all over body  . Dizziness and weakness   Immunizations  Administered    Name Date Dose VIS Date Route   Pfizer COVID-19 Vaccine 12/07/2019  3:43 PM 0.3 mL 09/23/2018 Intramuscular   Manufacturer: [place]   Lot: [id number]   [place]: [id number]

## 2019-12-14 ENCOUNTER — Ambulatory Visit: Payer: 59

## 2019-12-17 DIAGNOSIS — F411 Generalized anxiety disorder: Secondary | ICD-10-CM | POA: Diagnosis not present

## 2019-12-17 DIAGNOSIS — F3341 Major depressive disorder, recurrent, in partial remission: Secondary | ICD-10-CM | POA: Diagnosis not present

## 2019-12-17 MED FILL — buPROPion HCL ER (XL) 150 M: 150 | 30 days supply | Qty: 30 | Fill #0

## 2020-01-11 MED FILL — buPROPion HCL ER (XL) 150 M: 150 | 30 days supply | Qty: 30 | Fill #0

## 2020-01-24 ENCOUNTER — Emergency Department (HOSPITAL_BASED_OUTPATIENT_CLINIC_OR_DEPARTMENT_OTHER): Payer: 59

## 2020-01-24 ENCOUNTER — Emergency Department (HOSPITAL_BASED_OUTPATIENT_CLINIC_OR_DEPARTMENT_OTHER)
Admission: EM | Admit: 2020-01-24 | Discharge: 2020-01-24 | Disposition: A | Discharge status: Home / Self Care | Payer: 59 | Attending: Emergency Medicine | Admitting: Emergency Medicine

## 2020-01-24 ENCOUNTER — Encounter (HOSPITAL_BASED_OUTPATIENT_CLINIC_OR_DEPARTMENT_OTHER): Payer: Self-pay | Admitting: Emergency Medicine

## 2020-01-24 DIAGNOSIS — R0789 Other chest pain: Secondary | ICD-10-CM | POA: Diagnosis not present

## 2020-01-24 DIAGNOSIS — R079 Chest pain, unspecified: Secondary | ICD-10-CM | POA: Insufficient documentation

## 2020-01-24 DIAGNOSIS — R0602 Shortness of breath: Secondary | ICD-10-CM | POA: Insufficient documentation

## 2020-01-24 DIAGNOSIS — Z882 Allergy status to sulfonamides status: Secondary | ICD-10-CM | POA: Insufficient documentation

## 2020-01-24 DIAGNOSIS — I10 Essential (primary) hypertension: Secondary | ICD-10-CM | POA: Diagnosis not present

## 2020-01-24 DIAGNOSIS — F17200 Nicotine dependence, unspecified, uncomplicated: Secondary | ICD-10-CM | POA: Diagnosis not present

## 2020-01-24 LAB — BASIC METABOLIC PANEL
Anion gap: 9 (ref 5–15)
BUN: 12 mg/dL (ref 6–20)
CO2: 26 mmol/L (ref 22–32)
Calcium: 9 mg/dL (ref 8.9–10.3)
Chloride: 101 mmol/L (ref 98–111)
Creatinine, Ser: 1.16 mg/dL (ref 0.61–1.24)
GFR calc Af Amer: >60 mL/min (ref >60)
GFR calc non Af Amer: >60 mL/min (ref >60)
Glucose, Bld: 85 mg/dL (ref 70–99)
Potassium: 3.7 mmol/L (ref 3.5–5.1)
Sodium: 136 mmol/L (ref 135–145)

## 2020-01-24 LAB — CBC WITH DIFFERENTIAL/PLATELET
Abs Immature Granulocytes: 0.02 10*3/uL (ref 0.00–0.07)
Basophils Absolute: 0.1 10*3/uL (ref 0.0–0.1)
Basophils Relative: 1 %
Eosinophils Absolute: 0.1 10*3/uL (ref 0.0–0.5)
Eosinophils Relative: 1 %
HCT: 45.7 % (ref 39.0–52.0)
Hemoglobin: 15.6 g/dL (ref 13.0–17.0)
Immature Granulocytes: 0 %
Lymphocytes Relative: 40 %
Lymphs Abs: 2.6 10*3/uL (ref 0.7–4.0)
MCH: 29.6 pg (ref 26.0–34.0)
MCHC: 34.1 g/dL (ref 30.0–36.0)
MCV: 86.7 fL (ref 80.0–100.0)
Monocytes Absolute: 0.5 10*3/uL (ref 0.1–1.0)
Monocytes Relative: 7 %
Neutro Abs: 3.3 10*3/uL (ref 1.7–7.7)
Neutrophils Relative %: 51 %
Platelets: 254 10*3/uL (ref 150–400)
RBC: 5.27 MIL/uL (ref 4.22–5.81)
RDW: 12.4 % (ref 11.5–15.5)
WBC: 6.4 10*3/uL (ref 4.0–10.5)
nRBC: 0 % (ref 0.0–0.2)

## 2020-01-24 LAB — TROPONIN I (HIGH SENSITIVITY): Troponin I (High Sensitivity): <2 ng/L (ref <18)

## 2020-01-24 LAB — D-DIMER, QUANTITATIVE: D-Dimer, Quant: 0.3 ug/mL-FEU (ref 0.00–0.50)

## 2020-01-24 NOTE — ED Notes (Signed)
O2 97-98% on RA while ambulating

## 2020-01-24 NOTE — ED Triage Notes (Signed)
SOB for several months with chest discomfort.

## 2020-01-24 NOTE — Discharge Instructions (Signed)
As discussed, all of your labs were reassuring today.  Your oxygen remained normal throughout your ER stay.  Please follow-up with your PCP for further evaluation.  Return to the ER for new or worsening symptoms.

## 2020-01-24 NOTE — ED Provider Notes (Signed)
[facility] EMERGENCY DEPARTMENT Provider Note   CSN: [id number] Arrival date & time: 01/24/20  1642     History Chief Complaint  Patient presents with  . Shortness of Breath    Richard Dominguez is a 24 y.o. male with no significant past medical history who presents to the ED due to persistent shortness of breath for the past few months.  Patient states it is difficult to take a deep breath and associated with intermittent central, chest pain.  Patient states shortness of breath is both at rest and with exertion.  Denies history of asthma.  Denies fever and chills.  Denies cough.  No treatment prior to arrival.  Denies history of blood clots, recent surgeries, recent long immobilizations, and hormonal treatments.  Denies lower extremity edema.  Denies aggravating or alleviating factors.  History obtained from patient and past medical records. No interpreter used during encounter.      Past Medical History:  Diagnosis Date  . Acne   . 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 Chronic sinusitis 06/2017  . Complication of anesthesia    was told that he became "obtunded" when his neck was hyperextended during wisdom teeth extraction, per mother  . Family history of adverse reaction to anesthesia    pt's father wakes up angry    Patient Active Problem List   Diagnosis Date Noted  . Impacted cerumen of both ears 12/06/2017  . Chronic pansinusitis 08/09/2017    Past Surgical History:  Procedure Laterality Date  . CYST EXCISION     lower lip  . SINUS ENDO W/FUSION Bilateral 08/02/2017   Procedure: Endoscopic sinus surgery with maxillary antrostomy with stripping and anterior ethmoidectomy;  Surgeon: Melissa Montane, MD;  Location: [place];  Service: ENT;  Laterality: Bilateral;  Endoscopic sinus surgery with maxillary antrostomy with stripping and anterior ethmoidectomy  . TYMPANOSTOMY TUBE PLACEMENT Bilateral   . WISDOM TOOTH EXTRACTION          Family History  Problem Relation Age of Onset  . Anesthesia problems Father        wakes up angry    Social History   Tobacco Use  . Smoking status: Never Smoker  . Smokeless tobacco: Never Used  Vaping Use  . Vaping Use: Never used  Substance Use Topics  . Alcohol use: Yes    Comment: occasionally  . Drug use: No    Home Medications Prior to Admission medications   Medication Sig Start Date End Date Taking? Authorizing Provider  Adapalene-Benzoyl Peroxide (EPIDUO) 0.1-2.5 % gel Apply topically at bedtime.    [provider]  buPROPion (WELLBUTRIN XL) 150 MG 24 hr tablet Take 150 mg by mouth every morning. 01/11/20   [provider]  cephALEXin (KEFLEX) 500 MG capsule Take 1 capsule (500 mg total) by mouth 3 (three) times daily. Patient not taking: Reported on 12/19/2017 08/02/17   Melissa Montane, MD  escitalopram (LEXAPRO) 20 MG tablet Take 20 mg by mouth daily. 01/07/20   [provider]  HYDROcodone-acetaminophen (LORTAB) 5-325 MG tablet Take 1 tablet by mouth every 6 (six) hours as needed for moderate pain. Patient not taking: Reported on 12/19/2017 08/02/17   Melissa Montane, MD  neomycin-polymyxin-hydrocortisone (CORTISPORIN) OTIC solution Apply 1-2 drops to toe after soaking twice a day 07/18/18   Wallene Huh, DPM    Allergies    Sulfa antibiotics and Sulfasalazine  Review of Systems   Review of Systems  Constitutional: Negative for chills and fever.  Respiratory: Positive for shortness of breath. Negative for cough.   Cardiovascular: Positive for chest pain. Negative for leg swelling.  Gastrointestinal: Negative for abdominal pain, nausea and vomiting.  All other systems reviewed and are negative.   Physical Exam Updated Vital Signs BP 128/89 (BP Location: Right Arm)   Pulse 65   Temp 98.4 F (36.9 C) (Oral)   Resp 18   Ht 6\' 4"  (1.93 m)   Wt 86.2 kg   SpO2 99%   BMI 23.13 kg/m   Physical Exam Vitals and nursing note  reviewed.  Constitutional:      General: He is not in acute distress.    Appearance: He is not ill-appearing.  HENT:     Head: Normocephalic.  Eyes:     Pupils: Pupils are equal, round, and reactive to light.  Cardiovascular:     Rate and Rhythm: Normal rate and regular rhythm.     Pulses: Normal pulses.     Heart sounds: Normal heart sounds. No murmur heard.  No friction rub. No gallop.   Pulmonary:     Effort: Pulmonary effort is normal.     Breath sounds: Normal breath sounds.     Comments: Respirations equal and unlabored, patient able to speak in full sentences, lungs clear to auscultation bilaterally  Abdominal:     General: Abdomen is flat. There is no distension.     Palpations: Abdomen is soft.     Tenderness: There is no abdominal tenderness. There is no guarding or rebound.  Musculoskeletal:     Cervical back: Neck supple.     Comments: No lower extremity edema.  Negative Homan sign bilaterally.  No calf tenderness bilaterally.  Skin:    General: Skin is warm and dry.  Neurological:     General: No focal deficit present.     Mental Status: He is alert.  Psychiatric:        Mood and Affect: Mood normal.        Behavior: Behavior normal.     ED Results / Procedures / Treatments   Labs (all labs ordered are listed, but only abnormal results are displayed) Labs Reviewed  CBC WITH DIFFERENTIAL/PLATELET  BASIC METABOLIC PANEL  D-DIMER, QUANTITATIVE (NOT AT [facility])  TROPONIN I (HIGH SENSITIVITY)    EKG None  Radiology DG Chest 2 View  Result Date: 01/24/2020 CLINICAL DATA:  Shortness of breath and chest tightness for several months. Ex-smoker. EXAM: CHEST - 2 VIEW COMPARISON:  None. FINDINGS: Normal sized heart. Clear lungs with normal vascularity. Minimally elevated left hemidiaphragm. Mild scoliosis. IMPRESSION: No acute abnormality. Electronically Signed   By: Claudie Revering M.D.   On: 01/24/2020 17:23    Procedures Procedures (including critical care  time)  Medications Ordered in ED Medications - No data to display  ED Course  I have reviewed the triage vital signs and the nursing notes.  Pertinent labs & imaging results that were available during my care of the patient were reviewed by me and considered in my medical decision making (see chart for details).  Clinical Course as of Jan 23 1926  Sun Jan 24, 2020  1839 D-Dimer, Quant: 0.30 [CA]    Clinical Course User Index [CA] Karie Kirks   MDM Rules/Calculators/A&P                         24 year old male presents to the ED due to persistent  shortness of breath for the past few months.  Patient states he intermittently has central chest pain with deep inspiration.  Denies history of blood clots, recent surgeries, recent long immobilizations, and hormonal treatments.  No history of asthma.  No infectious symptoms.  Upon arrival, vitals all within normal limits.  O2 saturation at 98% on room air.  Patient in no acute distress and non-ill-appearing.  Lungs clear to auscultation bilaterally.  Patient speaking in full sentences.  No accessory muscle usage.  No lower extremity edema.  Negative Homans' sign bilaterally.  Will obtain routine labs to rule out symptomatic anemia and electrolyte abnormalities.  We will also obtain 1 troponin and D-dimer to rule out PE and cardiac etiology.  Chest x-ray personally reviewed which is negative for any signs of pneumonia, pneumothorax, or widened mediastinum.  EKG personally reviewed which demonstrates sinus bradycardia with sinus arrhythmia.  No signs of acute ischemia.  CBC unremarkable with no leukocytosis and normal hemoglobin.  Doubt symptomatic anemia.  D-dimer normal at 0.30.  Doubt PE.  Troponin normal.  Low suspicion for ACS.  Delta troponin not warranted at this time given chest pain has been present for the past few months.  Patient able to ambulate here in the ED and maintained O2 saturation above 95% on room air without  difficulty.  Discussed results with patient and mother at bedside.  Advised patient to follow-up with PCP for further evaluation of symptoms not improved within the next week. Strict ED precautions discussed with patient. Patient states understanding and agrees to plan. Patient discharged home in no acute distress and stable vitals. Final Clinical Impression(s) / ED Diagnoses Final diagnoses:  Shortness of breath  Nonspecific chest pain    Rx / DC Orders ED Discharge Orders    None       Karie Kirks 01/24/20 Kathyrn Drown    Charlesetta Shanks, MD 01/24/20 [phone]

## 2020-02-03 DIAGNOSIS — R0602 Shortness of breath: Secondary | ICD-10-CM | POA: Diagnosis not present

## 2020-02-03 DIAGNOSIS — F411 Generalized anxiety disorder: Secondary | ICD-10-CM | POA: Diagnosis not present

## 2020-02-03 DIAGNOSIS — F3341 Major depressive disorder, recurrent, in partial remission: Secondary | ICD-10-CM | POA: Diagnosis not present

## 2020-02-04 MED FILL — buPROPion HCL ER (XL) 150 M: 150 | 90 days supply | Qty: 90 | Fill #0

## 2020-02-04 MED FILL — EPIDUO FORTE 0.3-2.5% GEL P: 0.3-2.5 | 30 days supply | Qty: 45 | Fill #3

## 2020-02-25 ENCOUNTER — Other Ambulatory Visit (HOSPITAL_COMMUNITY): Payer: Self-pay | Admitting: Family Medicine

## 2020-02-25 DIAGNOSIS — R0602 Shortness of breath: Secondary | ICD-10-CM | POA: Diagnosis not present

## 2020-02-25 DIAGNOSIS — F3341 Major depressive disorder, recurrent, in partial remission: Secondary | ICD-10-CM | POA: Diagnosis not present

## 2020-03-15 ENCOUNTER — Other Ambulatory Visit: Payer: Self-pay

## 2020-03-15 ENCOUNTER — Ambulatory Visit (HOSPITAL_COMMUNITY): Payer: 59 | Attending: Internal Medicine

## 2020-03-15 DIAGNOSIS — R0602 Shortness of breath: Secondary | ICD-10-CM | POA: Insufficient documentation

## 2020-03-15 LAB — ECHOCARDIOGRAM COMPLETE
Area-P 1/2: 1.5 cm2
S' Lateral: 3.3 cm

## 2020-05-09 ENCOUNTER — Other Ambulatory Visit (HOSPITAL_COMMUNITY): Payer: Self-pay | Admitting: Family Medicine

## 2020-05-09 MED FILL — buPROPion HCL ER (XL) 150 M: 150 | 90 days supply | Qty: 90 | Fill #0

## 2020-05-09 MED FILL — ESCITALOPRAM 20 MG TABLET: 20 | 90 days supply | Qty: 90 | Fill #1

## 2020-05-18 MED FILL — EPIDUO FORTE 0.3-2.5% GEL P: 0.3-2.5 | 30 days supply | Qty: 45 | Fill #4

## 2020-06-16 DIAGNOSIS — H6123 Impacted cerumen, bilateral: Secondary | ICD-10-CM | POA: Diagnosis not present

## 2020-06-16 DIAGNOSIS — H900 Conductive hearing loss, bilateral: Secondary | ICD-10-CM | POA: Diagnosis not present

## 2020-08-01 MED FILL — ESCITALOPRAM 20 MG TABLET: 20 | 90 days supply | Qty: 90 | Fill #2

## 2020-08-01 MED FILL — buPROPion HCL ER (XL) 150 M: 150 | 90 days supply | Qty: 90 | Fill #1

## 2020-08-15 ENCOUNTER — Other Ambulatory Visit: Payer: Self-pay | Admitting: Physician Assistant

## 2020-09-30 ENCOUNTER — Other Ambulatory Visit (HOSPITAL_COMMUNITY): Payer: Self-pay | Admitting: Family Medicine

## 2020-09-30 DIAGNOSIS — B354 Tinea corporis: Secondary | ICD-10-CM | POA: Diagnosis not present

## 2020-09-30 DIAGNOSIS — L309 Dermatitis, unspecified: Secondary | ICD-10-CM | POA: Diagnosis not present

## 2020-09-30 MED FILL — CLOTRIMAZOLE-BETAMETHASONE: 1-0.05 | 21 days supply | Qty: 45 | Fill #0

## 2020-11-09 ENCOUNTER — Other Ambulatory Visit (HOSPITAL_COMMUNITY): Payer: Self-pay

## 2020-11-09 DIAGNOSIS — F3341 Major depressive disorder, recurrent, in partial remission: Secondary | ICD-10-CM | POA: Diagnosis not present

## 2020-11-09 DIAGNOSIS — F411 Generalized anxiety disorder: Secondary | ICD-10-CM | POA: Diagnosis not present

## 2020-11-09 MED ORDER — ESCITALOPRAM OXALATE 20 MG PO TABS
20.0000 mg | ORAL_TABLET | Freq: Every day | ORAL | 3 refills | Status: DC
  Filled 2020-11-09: qty 90, 90d supply, fill #0
  Filled 2021-01-25: qty 90, 90d supply, fill #1
  Filled 2021-06-05: qty 90, 90d supply, fill #2
  Filled 2021-10-27: qty 90, 90d supply, fill #3

## 2020-11-09 MED ORDER — BUPROPION HCL ER (XL) 300 MG PO TB24
300.0000 mg | ORAL_TABLET | Freq: Every morning | ORAL | 3 refills | Status: DC
  Filled 2020-11-09: qty 90, 90d supply, fill #0
  Filled 2021-01-25: qty 90, 90d supply, fill #1
  Filled 2021-06-05: qty 90, 90d supply, fill #2
  Filled 2021-10-27: qty 90, 90d supply, fill #3

## 2020-11-15 ENCOUNTER — Other Ambulatory Visit (HOSPITAL_COMMUNITY): Payer: Self-pay

## 2020-11-15 DIAGNOSIS — M545 Low back pain, unspecified: Secondary | ICD-10-CM | POA: Diagnosis not present

## 2020-11-15 MED ORDER — CYCLOBENZAPRINE HCL 5 MG PO TABS
5.0000 mg | ORAL_TABLET | Freq: Three times a day (TID) | ORAL | 0 refills | Status: DC
  Filled 2020-11-15: qty 21, 7d supply, fill #0

## 2020-11-15 MED ORDER — NAPROXEN 500 MG PO TABS
500.0000 mg | ORAL_TABLET | Freq: Two times a day (BID) | ORAL | 0 refills | Status: DC
  Filled 2020-11-15: qty 20, 10d supply, fill #0

## 2020-11-23 ENCOUNTER — Other Ambulatory Visit (HOSPITAL_COMMUNITY): Payer: Self-pay

## 2020-11-23 DIAGNOSIS — L700 Acne vulgaris: Secondary | ICD-10-CM | POA: Diagnosis not present

## 2020-11-23 DIAGNOSIS — M545 Low back pain, unspecified: Secondary | ICD-10-CM | POA: Diagnosis not present

## 2020-11-23 MED ORDER — ADAPALENE-BENZOYL PEROXIDE 0.1-2.5 % EX GEL
1.0000 "application " | Freq: Every day | CUTANEOUS | 3 refills | Status: DC
  Filled 2020-11-23: qty 135, 90d supply, fill #0
  Filled 2020-11-24 – 2021-07-05 (×2): qty 45, 30d supply, fill #0

## 2020-11-24 ENCOUNTER — Other Ambulatory Visit (HOSPITAL_COMMUNITY): Payer: Self-pay

## 2020-11-25 ENCOUNTER — Other Ambulatory Visit (HOSPITAL_COMMUNITY): Payer: Self-pay

## 2020-12-02 ENCOUNTER — Other Ambulatory Visit (HOSPITAL_COMMUNITY): Payer: Self-pay

## 2020-12-05 ENCOUNTER — Other Ambulatory Visit (HOSPITAL_COMMUNITY): Payer: Self-pay

## 2020-12-05 MED ORDER — ADAPALENE-BENZOYL PEROXIDE 0.3-2.5 % EX GEL
CUTANEOUS | 3 refills | Status: DC
  Filled 2020-12-05: qty 45, 90d supply, fill #0
  Filled 2021-07-05 (×2): qty 45, 90d supply, fill #1

## 2021-01-10 DIAGNOSIS — M7989 Other specified soft tissue disorders: Secondary | ICD-10-CM | POA: Diagnosis not present

## 2021-01-25 ENCOUNTER — Other Ambulatory Visit (HOSPITAL_COMMUNITY): Payer: Self-pay

## 2021-02-22 DIAGNOSIS — H6123 Impacted cerumen, bilateral: Secondary | ICD-10-CM | POA: Diagnosis not present

## 2021-03-29 DIAGNOSIS — F320 Major depressive disorder, single episode, mild: Secondary | ICD-10-CM | POA: Diagnosis not present

## 2021-04-12 DIAGNOSIS — F320 Major depressive disorder, single episode, mild: Secondary | ICD-10-CM | POA: Diagnosis not present

## 2021-04-27 DIAGNOSIS — F320 Major depressive disorder, single episode, mild: Secondary | ICD-10-CM | POA: Diagnosis not present

## 2021-05-02 DIAGNOSIS — Z13220 Encounter for screening for lipoid disorders: Secondary | ICD-10-CM | POA: Diagnosis not present

## 2021-05-02 DIAGNOSIS — Z0000 Encounter for general adult medical examination without abnormal findings: Secondary | ICD-10-CM | POA: Diagnosis not present

## 2021-05-10 DIAGNOSIS — F320 Major depressive disorder, single episode, mild: Secondary | ICD-10-CM | POA: Diagnosis not present

## 2021-05-23 DIAGNOSIS — F320 Major depressive disorder, single episode, mild: Secondary | ICD-10-CM | POA: Diagnosis not present

## 2021-06-05 ENCOUNTER — Other Ambulatory Visit (HOSPITAL_COMMUNITY): Payer: Self-pay

## 2021-06-07 DIAGNOSIS — F320 Major depressive disorder, single episode, mild: Secondary | ICD-10-CM | POA: Diagnosis not present

## 2021-06-20 DIAGNOSIS — F320 Major depressive disorder, single episode, mild: Secondary | ICD-10-CM | POA: Diagnosis not present

## 2021-07-04 DIAGNOSIS — F320 Major depressive disorder, single episode, mild: Secondary | ICD-10-CM | POA: Diagnosis not present

## 2021-07-05 ENCOUNTER — Other Ambulatory Visit (HOSPITAL_COMMUNITY): Payer: Self-pay

## 2021-07-06 ENCOUNTER — Other Ambulatory Visit (HOSPITAL_COMMUNITY): Payer: Self-pay

## 2021-07-18 DIAGNOSIS — F320 Major depressive disorder, single episode, mild: Secondary | ICD-10-CM | POA: Diagnosis not present

## 2021-08-02 DIAGNOSIS — F320 Major depressive disorder, single episode, mild: Secondary | ICD-10-CM | POA: Diagnosis not present

## 2021-08-17 DIAGNOSIS — F320 Major depressive disorder, single episode, mild: Secondary | ICD-10-CM | POA: Diagnosis not present

## 2021-08-30 DIAGNOSIS — F320 Major depressive disorder, single episode, mild: Secondary | ICD-10-CM | POA: Diagnosis not present

## 2021-09-14 DIAGNOSIS — F320 Major depressive disorder, single episode, mild: Secondary | ICD-10-CM | POA: Diagnosis not present

## 2021-10-04 DIAGNOSIS — F320 Major depressive disorder, single episode, mild: Secondary | ICD-10-CM | POA: Diagnosis not present

## 2021-10-18 DIAGNOSIS — F320 Major depressive disorder, single episode, mild: Secondary | ICD-10-CM | POA: Diagnosis not present

## 2021-10-27 ENCOUNTER — Other Ambulatory Visit (HOSPITAL_COMMUNITY): Payer: Self-pay

## 2021-11-01 DIAGNOSIS — F320 Major depressive disorder, single episode, mild: Secondary | ICD-10-CM | POA: Diagnosis not present

## 2021-11-14 DIAGNOSIS — F320 Major depressive disorder, single episode, mild: Secondary | ICD-10-CM | POA: Diagnosis not present

## 2021-11-30 DIAGNOSIS — F320 Major depressive disorder, single episode, mild: Secondary | ICD-10-CM | POA: Diagnosis not present

## 2021-12-14 DIAGNOSIS — F320 Major depressive disorder, single episode, mild: Secondary | ICD-10-CM | POA: Diagnosis not present

## 2021-12-27 ENCOUNTER — Other Ambulatory Visit (HOSPITAL_COMMUNITY): Payer: Self-pay

## 2021-12-27 MED ORDER — ESCITALOPRAM OXALATE 20 MG PO TABS
20.0000 mg | ORAL_TABLET | Freq: Every day | ORAL | 1 refills | Status: DC
  Filled 2021-12-27 – 2022-01-29 (×2): qty 90, 90d supply, fill #0
  Filled 2022-08-22: qty 90, 90d supply, fill #1

## 2021-12-27 MED ORDER — BUPROPION HCL ER (XL) 300 MG PO TB24
300.0000 mg | ORAL_TABLET | Freq: Every morning | ORAL | 1 refills | Status: DC
  Filled 2021-12-27 – 2022-01-29 (×2): qty 90, 90d supply, fill #0
  Filled 2022-08-22: qty 90, 90d supply, fill #1

## 2022-01-02 ENCOUNTER — Other Ambulatory Visit (HOSPITAL_COMMUNITY): Payer: Self-pay

## 2022-01-02 DIAGNOSIS — F320 Major depressive disorder, single episode, mild: Secondary | ICD-10-CM | POA: Diagnosis not present

## 2022-01-02 DIAGNOSIS — S63501A Unspecified sprain of right wrist, initial encounter: Secondary | ICD-10-CM | POA: Diagnosis not present

## 2022-01-02 MED ORDER — MELOXICAM 15 MG PO TABS
15.0000 mg | ORAL_TABLET | Freq: Every day | ORAL | 1 refills | Status: DC | PRN
  Filled 2022-01-02: qty 30, 30d supply, fill #0

## 2022-01-10 DIAGNOSIS — H5203 Hypermetropia, bilateral: Secondary | ICD-10-CM | POA: Diagnosis not present

## 2022-01-17 DIAGNOSIS — H6123 Impacted cerumen, bilateral: Secondary | ICD-10-CM | POA: Diagnosis not present

## 2022-01-24 DIAGNOSIS — F320 Major depressive disorder, single episode, mild: Secondary | ICD-10-CM | POA: Diagnosis not present

## 2022-01-29 ENCOUNTER — Other Ambulatory Visit (HOSPITAL_COMMUNITY): Payer: Self-pay

## 2022-02-08 DIAGNOSIS — F320 Major depressive disorder, single episode, mild: Secondary | ICD-10-CM | POA: Diagnosis not present

## 2022-02-21 DIAGNOSIS — F320 Major depressive disorder, single episode, mild: Secondary | ICD-10-CM | POA: Diagnosis not present

## 2022-03-07 DIAGNOSIS — H5111 Convergence insufficiency: Secondary | ICD-10-CM | POA: Diagnosis not present

## 2022-03-09 DIAGNOSIS — F320 Major depressive disorder, single episode, mild: Secondary | ICD-10-CM | POA: Diagnosis not present

## 2022-03-14 ENCOUNTER — Ambulatory Visit: Payer: 59 | Admitting: Physician Assistant

## 2022-03-19 DIAGNOSIS — F320 Major depressive disorder, single episode, mild: Secondary | ICD-10-CM | POA: Diagnosis not present

## 2022-04-06 DIAGNOSIS — F320 Major depressive disorder, single episode, mild: Secondary | ICD-10-CM | POA: Diagnosis not present

## 2022-05-03 ENCOUNTER — Other Ambulatory Visit (HOSPITAL_COMMUNITY): Payer: Self-pay

## 2022-05-03 DIAGNOSIS — R61 Generalized hyperhidrosis: Secondary | ICD-10-CM | POA: Diagnosis not present

## 2022-05-03 DIAGNOSIS — F411 Generalized anxiety disorder: Secondary | ICD-10-CM | POA: Diagnosis not present

## 2022-05-03 DIAGNOSIS — L709 Acne, unspecified: Secondary | ICD-10-CM | POA: Diagnosis not present

## 2022-05-03 DIAGNOSIS — F3341 Major depressive disorder, recurrent, in partial remission: Secondary | ICD-10-CM | POA: Diagnosis not present

## 2022-05-03 DIAGNOSIS — E782 Mixed hyperlipidemia: Secondary | ICD-10-CM | POA: Diagnosis not present

## 2022-05-03 DIAGNOSIS — Z0000 Encounter for general adult medical examination without abnormal findings: Secondary | ICD-10-CM | POA: Diagnosis not present

## 2022-05-03 MED ORDER — ADAPALENE-BENZOYL PEROXIDE 0.3-2.5 % EX GEL
1.0000 | Freq: Every day | CUTANEOUS | 3 refills | Status: DC
  Filled 2022-05-03: qty 135, 90d supply, fill #0
  Filled 2022-07-25: qty 45, 30d supply, fill #0

## 2022-05-03 MED ORDER — ESCITALOPRAM OXALATE 20 MG PO TABS
20.0000 mg | ORAL_TABLET | Freq: Every day | ORAL | 3 refills | Status: DC
  Filled 2022-05-03: qty 90, 90d supply, fill #0
  Filled 2022-12-27: qty 90, 90d supply, fill #1
  Filled 2023-03-26: qty 90, 90d supply, fill #2

## 2022-05-03 MED ORDER — BUPROPION HCL ER (XL) 300 MG PO TB24
300.0000 mg | ORAL_TABLET | Freq: Every morning | ORAL | 3 refills | Status: DC
  Filled 2022-05-03: qty 90, 90d supply, fill #0
  Filled 2022-12-27: qty 90, 90d supply, fill #1
  Filled 2023-03-26: qty 90, 90d supply, fill #2

## 2022-05-31 DIAGNOSIS — F320 Major depressive disorder, single episode, mild: Secondary | ICD-10-CM | POA: Diagnosis not present

## 2022-06-20 ENCOUNTER — Other Ambulatory Visit (HOSPITAL_COMMUNITY): Payer: Self-pay

## 2022-06-20 DIAGNOSIS — R21 Rash and other nonspecific skin eruption: Secondary | ICD-10-CM | POA: Diagnosis not present

## 2022-06-20 MED ORDER — TRIAMCINOLONE ACETONIDE 0.1 % EX CREA
1.0000 | TOPICAL_CREAM | Freq: Two times a day (BID) | CUTANEOUS | 1 refills | Status: DC
  Filled 2022-06-20: qty 80, 30d supply, fill #0
  Filled 2022-07-25: qty 80, 30d supply, fill #1

## 2022-06-20 MED ORDER — TRIAMCINOLONE ACETONIDE 0.1 % EX CREA
1.0000 | TOPICAL_CREAM | Freq: Two times a day (BID) | CUTANEOUS | 1 refills | Status: DC
  Filled 2022-06-20: qty 80, 30d supply, fill #0

## 2022-07-09 DIAGNOSIS — F320 Major depressive disorder, single episode, mild: Secondary | ICD-10-CM | POA: Diagnosis not present

## 2022-07-25 ENCOUNTER — Other Ambulatory Visit (HOSPITAL_COMMUNITY): Payer: Self-pay

## 2022-08-17 DIAGNOSIS — F320 Major depressive disorder, single episode, mild: Secondary | ICD-10-CM | POA: Diagnosis not present

## 2022-08-22 ENCOUNTER — Other Ambulatory Visit (HOSPITAL_COMMUNITY): Payer: Self-pay

## 2022-09-07 DIAGNOSIS — F320 Major depressive disorder, single episode, mild: Secondary | ICD-10-CM | POA: Diagnosis not present

## 2022-09-28 DIAGNOSIS — F320 Major depressive disorder, single episode, mild: Secondary | ICD-10-CM | POA: Diagnosis not present

## 2022-10-26 DIAGNOSIS — F320 Major depressive disorder, single episode, mild: Secondary | ICD-10-CM | POA: Diagnosis not present

## 2022-11-23 DIAGNOSIS — F320 Major depressive disorder, single episode, mild: Secondary | ICD-10-CM | POA: Diagnosis not present

## 2022-12-25 ENCOUNTER — Other Ambulatory Visit (HOSPITAL_COMMUNITY): Payer: Self-pay

## 2022-12-27 ENCOUNTER — Other Ambulatory Visit (HOSPITAL_COMMUNITY): Payer: Self-pay

## 2022-12-28 ENCOUNTER — Other Ambulatory Visit (HOSPITAL_COMMUNITY): Payer: Self-pay

## 2023-01-10 DIAGNOSIS — F320 Major depressive disorder, single episode, mild: Secondary | ICD-10-CM | POA: Diagnosis not present

## 2023-01-23 DIAGNOSIS — F320 Major depressive disorder, single episode, mild: Secondary | ICD-10-CM | POA: Diagnosis not present

## 2023-01-28 DIAGNOSIS — H5203 Hypermetropia, bilateral: Secondary | ICD-10-CM | POA: Diagnosis not present

## 2023-03-18 ENCOUNTER — Other Ambulatory Visit (HOSPITAL_COMMUNITY): Payer: Self-pay

## 2023-03-18 DIAGNOSIS — F331 Major depressive disorder, recurrent, moderate: Secondary | ICD-10-CM | POA: Diagnosis not present

## 2023-03-18 DIAGNOSIS — F419 Anxiety disorder, unspecified: Secondary | ICD-10-CM | POA: Diagnosis not present

## 2023-03-18 DIAGNOSIS — Z1331 Encounter for screening for depression: Secondary | ICD-10-CM | POA: Diagnosis not present

## 2023-03-18 MED ORDER — VRAYLAR 1.5 MG PO CAPS
1.5000 mg | ORAL_CAPSULE | Freq: Every day | ORAL | 0 refills | Status: DC
  Filled 2023-03-18: qty 30, 30d supply, fill #0

## 2023-03-26 ENCOUNTER — Other Ambulatory Visit (HOSPITAL_COMMUNITY): Payer: Self-pay

## 2023-04-16 ENCOUNTER — Other Ambulatory Visit (HOSPITAL_COMMUNITY): Payer: Self-pay

## 2023-04-16 ENCOUNTER — Other Ambulatory Visit: Payer: Self-pay

## 2023-04-16 DIAGNOSIS — F331 Major depressive disorder, recurrent, moderate: Secondary | ICD-10-CM | POA: Diagnosis not present

## 2023-04-16 DIAGNOSIS — F419 Anxiety disorder, unspecified: Secondary | ICD-10-CM | POA: Diagnosis not present

## 2023-04-16 MED ORDER — ESCITALOPRAM OXALATE 20 MG PO TABS
20.0000 mg | ORAL_TABLET | Freq: Every day | ORAL | 0 refills | Status: DC
  Filled 2023-04-16: qty 30, 30d supply, fill #0

## 2023-04-16 MED ORDER — VRAYLAR 3 MG PO CAPS
3.0000 mg | ORAL_CAPSULE | Freq: Every day | ORAL | 0 refills | Status: DC
  Filled 2023-04-16: qty 30, 30d supply, fill #0

## 2023-04-16 MED ORDER — BUPROPION HCL ER (XL) 300 MG PO TB24
300.0000 mg | ORAL_TABLET | Freq: Every morning | ORAL | 0 refills | Status: DC
  Filled 2023-04-16: qty 30, 30d supply, fill #0

## 2023-04-18 ENCOUNTER — Other Ambulatory Visit: Payer: Self-pay

## 2023-04-29 ENCOUNTER — Other Ambulatory Visit (HOSPITAL_COMMUNITY): Payer: Self-pay

## 2023-04-29 MED ORDER — ARIPIPRAZOLE 2 MG PO TABS
2.0000 mg | ORAL_TABLET | Freq: Every day | ORAL | 0 refills | Status: DC
  Filled 2023-04-29: qty 30, 30d supply, fill #0

## 2023-05-09 DIAGNOSIS — Z23 Encounter for immunization: Secondary | ICD-10-CM | POA: Diagnosis not present

## 2023-05-09 DIAGNOSIS — E782 Mixed hyperlipidemia: Secondary | ICD-10-CM | POA: Diagnosis not present

## 2023-05-09 DIAGNOSIS — Z0000 Encounter for general adult medical examination without abnormal findings: Secondary | ICD-10-CM | POA: Diagnosis not present

## 2023-05-09 DIAGNOSIS — L709 Acne, unspecified: Secondary | ICD-10-CM | POA: Diagnosis not present

## 2023-05-13 ENCOUNTER — Encounter (INDEPENDENT_AMBULATORY_CARE_PROVIDER_SITE_OTHER): Payer: Self-pay

## 2023-05-13 ENCOUNTER — Other Ambulatory Visit (HOSPITAL_COMMUNITY): Payer: Self-pay

## 2023-05-13 ENCOUNTER — Ambulatory Visit (INDEPENDENT_AMBULATORY_CARE_PROVIDER_SITE_OTHER): Payer: Commercial Managed Care - PPO | Admitting: Otolaryngology

## 2023-05-13 VITALS — Ht 76.0 in | Wt 192.0 lb

## 2023-05-13 DIAGNOSIS — H6123 Impacted cerumen, bilateral: Secondary | ICD-10-CM

## 2023-05-13 DIAGNOSIS — F419 Anxiety disorder, unspecified: Secondary | ICD-10-CM | POA: Diagnosis not present

## 2023-05-13 DIAGNOSIS — F331 Major depressive disorder, recurrent, moderate: Secondary | ICD-10-CM | POA: Diagnosis not present

## 2023-05-13 MED ORDER — ESCITALOPRAM OXALATE 20 MG PO TABS
20.0000 mg | ORAL_TABLET | Freq: Every day | ORAL | 1 refills | Status: DC
  Filled 2023-06-10 – 2023-10-21 (×2): qty 30, 30d supply, fill #0
  Filled 2024-02-23 – 2024-02-24 (×2): qty 30, 30d supply, fill #1

## 2023-05-13 MED ORDER — ARIPIPRAZOLE 2 MG PO TABS
2.0000 mg | ORAL_TABLET | Freq: Every day | ORAL | 1 refills | Status: DC
  Filled 2023-05-13 – 2023-05-23 (×2): qty 30, 30d supply, fill #0

## 2023-05-13 MED ORDER — BUPROPION HCL ER (XL) 300 MG PO TB24
300.0000 mg | ORAL_TABLET | Freq: Every morning | ORAL | 1 refills | Status: DC
  Filled 2023-06-10 – 2023-10-21 (×2): qty 30, 30d supply, fill #0
  Filled ????-??-??: fill #0

## 2023-05-14 NOTE — Progress Notes (Signed)
Patient ID: Richard Dominguez, male   DOB: 01/13/1996, 27 y.o.   MRN: [id number]  CC: Recurrent cerumen impaction  Procedure: Bilateral cerumen disimpaction.    Indication: Cerumen impaction, resulting in ear discomfort and conductive hearing loss.    Description: The patient is placed supine on the exam table.  Under the operating microscope, the right ear canal is examined and is noted to be completely impacted with cerumen.  The cerumen is carefully removed with a combination of suction catheters, cerumen curette, and alligator forceps.  After the cerumen removal, the ear canal and tympanic membrane are noted to be normal.  No middle ear effusion is noted.  The same procedure is then repeated on the left side without exception. The patient tolerated the procedure well.  Follow-up care:  The patient is instructed not to use Q-tips to clean the ear canals.  The patient will follow up in 9 months.

## 2023-05-15 DIAGNOSIS — F320 Major depressive disorder, single episode, mild: Secondary | ICD-10-CM | POA: Diagnosis not present

## 2023-05-23 ENCOUNTER — Other Ambulatory Visit: Payer: Self-pay

## 2023-05-23 ENCOUNTER — Other Ambulatory Visit (HOSPITAL_COMMUNITY): Payer: Self-pay

## 2023-05-28 ENCOUNTER — Encounter: Payer: Self-pay | Admitting: Dermatology

## 2023-05-28 ENCOUNTER — Other Ambulatory Visit (HOSPITAL_COMMUNITY): Payer: Self-pay

## 2023-05-28 ENCOUNTER — Ambulatory Visit: Payer: Commercial Managed Care - PPO | Admitting: Dermatology

## 2023-05-28 VITALS — BP 130/85 | HR 68

## 2023-05-28 DIAGNOSIS — L700 Acne vulgaris: Secondary | ICD-10-CM | POA: Diagnosis not present

## 2023-05-28 DIAGNOSIS — L720 Epidermal cyst: Secondary | ICD-10-CM | POA: Diagnosis not present

## 2023-05-28 DIAGNOSIS — L729 Follicular cyst of the skin and subcutaneous tissue, unspecified: Secondary | ICD-10-CM

## 2023-05-28 MED ORDER — DOXYCYCLINE HYCLATE 100 MG PO TABS
100.0000 mg | ORAL_TABLET | Freq: Every day | ORAL | 0 refills | Status: DC
  Filled 2023-05-28: qty 30, 30d supply, fill #0

## 2023-05-28 MED ORDER — CABTREO 0.15-3.1-1.2 % EX GEL: 1.0000 | Freq: Every day | CUTANEOUS | 1 refills | Status: AC

## 2023-05-28 NOTE — Progress Notes (Signed)
   New Patient Visit   Subjective  Richard Dominguez is a 27 y.o. male who presents for the following: acne, spot on upper back  Patient states he  has acne and spot on upper back located at the face and back that he  would like to have examined. Pt stated he has been using Adapalene for his acne that was prescribed front his previous dermatologist. Patient reports the spot on his upper back have been there for 4 years. He reports the areas are not bothersome.Patient rates irritation 0 out of 10. He states that the areas have not spread. Patient reports he  has previously been treated for these areas. Patient denies Hx of bx. Patient denies family history of skin cancer(s).  The patient has spots, moles and lesions to be evaluated, some may be new or changing and the patient may have concern these could be cancer.   The following portions of the chart were reviewed this encounter and updated as appropriate: medications, allergies, medical history  Review of Systems:  No other skin or systemic complaints except as noted in HPI or Assessment and Plan.  Objective  Well appearing patient in no apparent distress; mood and affect are within normal limits.  A focused examination was performed of the following areas: face and upper back   Relevant exam findings are noted in the Assessment and Plan.               Assessment & Plan   ACNE VULGARIS Exam: Open comedones and inflammatory papules  Not at goal  Treatment Plan: - Prescribed Doxycycline 100 mg tab - take 1 tab daily - to help with inflammation. - Sample of Cabtreo given to use M, W, F.     - Encourage consistent use of acne treatments and moisturizers.    - Follow up in 3 months to assess improvement and adjust the treatment plan if necessary.   EPIDERMAL INCLUSION CYST Exam: Subcutaneous nodule near left shoulder  Benign-appearing. Exam most consistent with an epidermal inclusion cyst. Discussed that a cyst is a benign  growth that can grow over time and sometimes get irritated or inflamed. Recommend observation if it is not bothersome. Discussed option of surgical excision to remove it if it is growing, symptomatic, or other changes noted. Please call for new or changing lesions so they can be evaluated.  Treatment Plan: - Referred to Dr. Caralyn Guile for removal of cyst.   No follow-ups on file.    Documentation: I have reviewed the above documentation for accuracy and completeness, and I agree with the above.   I, Shirron Marcha Solders, CMA, am acting as scribe for [employer], DO.   Langston Reusing, DO

## 2023-05-28 NOTE — Patient Instructions (Addendum)
Hello Richard Dominguez,  Thank you for visiting [place] today. We appreciate your dedication to enhancing your skin health. Below is a summary of the essential instructions and recommendations from today's consultation:  - Medications Prescribed:   - Doxycycline: Take once daily with dinner to assist with your acne. It's important to take this medication with food to minimize the risk of an upset stomach.   - Cabtreo (Adapalene, Clindamycin, and Benzoyl Peroxide Cream): Begin with the samples provided today, applying every other night. Use only a pea-sized amount for each application, dabbing it onto the skin, followed by a moisturizer.  - Skincare Routine:   - Morning: Cleanse your face with Dove soap, followed by a moisturizer.   - Night: Again, cleanse with Dove soap and apply a moisturizer. On Monday, Wednesday, and Friday nights, include the prescribed cream in your routine before moisturizing.  - Pharmacy Coordination:   - Your prescription will be forwarded to [facility]. They will reach out to confirm insurance details and inform you about the copay. Payments can be processed over the phone, and your medication will be delivered to your address.  - Follow-Up:   - We have scheduled a follow-up appointment in three months to monitor your progress and take comparative photographs.   - If you are considering the removal of the cyst on your back, we will explore scheduling this procedure before your insurance changes on November 6.  - General Advice:   - Continue utilizing [place] as your body wash and moisturizer to mitigate dryness associated with acne treatments.   - Anticipate initial improvement in your acne within four weeks, with more significant improvement expected by the three-month mark.  Please adhere to these instructions carefully and reach out to our office should you have any questions or concerns.  Best regards,  Dr. Langston Reusing Dermatology Important Information  Due to  recent changes in healthcare laws, you may see results of your pathology and/or laboratory studies on MyChart before the doctors have had a chance to review them. We understand that in some cases there may be results that are confusing or concerning to you. Please understand that not all results are received at the same time and often the doctors may need to interpret multiple results in order to provide you with the best plan of care or course of treatment. Therefore, we ask that you please give [place] 2 business days to thoroughly review all your results before contacting the office for clarification. Should we see a critical lab result, you will be contacted sooner.   If You Need Anything After Your Visit  If you have any questions or concerns for your doctor, please call our main line at [phone] If no one answers, please leave a voicemail as directed and we will return your call as soon as possible. Messages left after 4 pm will be answered the following business day.   You may also send [place] a message via MyChart. We typically respond to MyChart messages within 1-2 business days.  For prescription refills, please ask your pharmacy to contact our office. Our fax number is [phone].  If you have an urgent issue when the clinic is closed that cannot wait until the next business day, you can page your doctor at the number below.    Please note that while we do our best to be available for urgent issues outside of office hours, we are not available 24/7.   If you have an urgent issue and are  unable to reach [place], you may choose to seek medical care at your doctor's office, retail clinic, urgent care center, or emergency room.  If you have a medical emergency, please immediately call 911 or go to the emergency department. In the event of inclement weather, please call our main line at [phone] for an update on the status of any delays or closures.  Dermatology Medication Tips: Please keep  the boxes that topical medications come in in order to help keep track of the instructions about where and how to use these. Pharmacies typically print the medication instructions only on the boxes and not directly on the medication tubes.   If your medication is too expensive, please contact our office at [phone] or send [place] a message through MyChart.   We are unable to tell what your co-pay for medications will be in advance as this is different depending on your insurance coverage. However, we may be able to find a substitute medication at lower cost or fill out paperwork to get insurance to cover a needed medication.   If a prior authorization is required to get your medication covered by your insurance company, please allow [place] 1-2 business days to complete this process.  Drug prices often vary depending on where the prescription is filled and some pharmacies may offer cheaper prices.  The website www.goodrx.com contains coupons for medications through different pharmacies. The prices here do not account for what the cost may be with help from insurance (it may be cheaper with your insurance), but the website can give you the price if you did not use any insurance.  - You can print the associated coupon and take it with your prescription to the pharmacy.  - You may also stop by our office during regular business hours and pick up a GoodRx coupon card.  - If you need your prescription sent electronically to a different pharmacy, notify our office through [facility] or by phone at [phone]

## 2023-05-30 ENCOUNTER — Encounter: Payer: Self-pay | Admitting: Dermatology

## 2023-05-30 ENCOUNTER — Other Ambulatory Visit (HOSPITAL_COMMUNITY): Payer: Self-pay

## 2023-05-30 ENCOUNTER — Ambulatory Visit: Payer: Commercial Managed Care - PPO | Admitting: Dermatology

## 2023-05-30 VITALS — BP 118/69 | HR 66

## 2023-05-30 DIAGNOSIS — D492 Neoplasm of unspecified behavior of bone, soft tissue, and skin: Secondary | ICD-10-CM

## 2023-05-30 DIAGNOSIS — D485 Neoplasm of uncertain behavior of skin: Secondary | ICD-10-CM

## 2023-05-30 DIAGNOSIS — L720 Epidermal cyst: Secondary | ICD-10-CM

## 2023-05-30 NOTE — Patient Instructions (Signed)

## 2023-05-30 NOTE — Progress Notes (Signed)
   Follow-Up Visit   Subjective  Richard Dominguez is a 27 y.o. male who presents for the following: Excision of neoplasm of skin of left shoulder  The following portions of the chart were reviewed this encounter and updated as appropriate: medications, allergies, medical history  Review of Systems:  No other skin or systemic complaints except as noted in HPI or Assessment and Plan.  Objective  Well appearing patient in no apparent distress; mood and affect are within normal limits.  A focused examination was performed of the following areas: Left shoulder Relevant physical exam findings are noted in the Assessment and Plan.   Left Shoulder       Assessment & Plan   Neoplasm of uncertain behavior of skin Left Shoulder  Skin excision  Lesion length (cm):  2.1 Lesion width (cm):  2.1 Margin per side (cm):  0.1 Total excision diameter (cm):  2.3 Informed consent: discussed and consent obtained   Timeout: patient name, date of birth, surgical site, and procedure verified   Procedure prep:  Patient was prepped and draped in usual sterile fashion Prep type:  Chlorhexidine Anesthesia: the lesion was anesthetized in a standard fashion   Anesthetic:  1% lidocaine w/ epinephrine 1-100,000 local infiltration Instrument used: #15 blade   Hemostasis achieved with: suture, pressure and electrodesiccation   Outcome: patient tolerated procedure well with no complications   Post-procedure details: sterile dressing applied and wound care instructions given   Dressing type: pressure dressing and bandage    Skin repair Complexity:  Intermediate Final length (cm):  2.8 Informed consent: discussed and consent obtained   Timeout: patient name, date of birth, surgical site, and procedure verified   Procedure prep:  Patient was prepped and draped in usual sterile fashion Prep type:  Chlorhexidine Anesthesia: the lesion was anesthetized in a standard fashion   Anesthetic:  1% lidocaine w/  epinephrine 1-100,000 local infiltration Reason for type of repair: reduce tension to allow closure, reduce the risk of dehiscence, infection, and necrosis, allow closure of the large defect and preserve normal anatomy   Undermining: edges undermined   Subcutaneous layers (deep stitches):  Suture size:  3-0 Suture type: PDS (polydioxanone)   Fine/surface layer approximation (top stitches):  Suture type: cyanoacrylate tissue glue   Suture type comment:  Steristrips Hemostasis achieved with: electrodesiccation Outcome: patient tolerated procedure well with no complications   Post-procedure details: sterile dressing applied and wound care instructions given   Dressing type: pressure dressing    Specimen 1 - Surgical pathology Differential Diagnosis: R/O cyst vs lipoma vs other  Check Margins: No    Return if symptoms worsen or fail to improve.  I, Joanie Coddington, CMA, am acting as scribe for Gwenith Daily, MD .   Documentation: I have reviewed the above documentation for accuracy and completeness, and I agree with the above.  Gwenith Daily, MD

## 2023-06-03 LAB — SURGICAL PATHOLOGY

## 2023-06-10 ENCOUNTER — Other Ambulatory Visit (HOSPITAL_COMMUNITY): Payer: Self-pay

## 2023-06-12 DIAGNOSIS — F320 Major depressive disorder, single episode, mild: Secondary | ICD-10-CM | POA: Diagnosis not present

## 2023-06-14 ENCOUNTER — Other Ambulatory Visit (HOSPITAL_COMMUNITY): Payer: Self-pay

## 2023-06-14 MED ORDER — BUPROPION HCL ER (XL) 300 MG PO TB24
300.0000 mg | ORAL_TABLET | Freq: Every morning | ORAL | 3 refills | Status: DC
  Filled 2023-06-14: qty 90, 90d supply, fill #0
  Filled 2024-02-23 – 2024-02-24 (×2): qty 30, 30d supply, fill #1
  Filled 2024-03-25: qty 30, 30d supply, fill #2
  Filled 2024-06-10: qty 30, 30d supply, fill #3

## 2023-06-14 MED ORDER — ESCITALOPRAM OXALATE 20 MG PO TABS
20.0000 mg | ORAL_TABLET | Freq: Every day | ORAL | 3 refills | Status: DC
  Filled 2023-06-14: qty 90, 90d supply, fill #0
  Filled 2024-06-10: qty 30, 30d supply, fill #1

## 2023-06-20 ENCOUNTER — Other Ambulatory Visit (HOSPITAL_COMMUNITY): Payer: Self-pay

## 2023-06-20 MED ORDER — ARIPIPRAZOLE 2 MG PO TABS
2.0000 mg | ORAL_TABLET | Freq: Every day | ORAL | 0 refills | Status: DC
  Filled 2023-06-20: qty 90, 90d supply, fill #0

## 2023-07-10 ENCOUNTER — Other Ambulatory Visit (HOSPITAL_COMMUNITY): Payer: Self-pay

## 2023-08-22 DIAGNOSIS — F320 Major depressive disorder, single episode, mild: Secondary | ICD-10-CM | POA: Diagnosis not present

## 2023-08-28 ENCOUNTER — Other Ambulatory Visit (HOSPITAL_COMMUNITY): Payer: Self-pay

## 2023-08-28 ENCOUNTER — Ambulatory Visit (INDEPENDENT_AMBULATORY_CARE_PROVIDER_SITE_OTHER): Payer: Self-pay | Admitting: Dermatology

## 2023-08-28 ENCOUNTER — Encounter: Payer: Self-pay | Admitting: Dermatology

## 2023-08-28 DIAGNOSIS — L700 Acne vulgaris: Secondary | ICD-10-CM

## 2023-08-28 DIAGNOSIS — L905 Scar conditions and fibrosis of skin: Secondary | ICD-10-CM

## 2023-08-28 MED ORDER — DOXYCYCLINE HYCLATE 100 MG PO TABS
100.0000 mg | ORAL_TABLET | Freq: Every day | ORAL | 3 refills | Status: DC
  Filled 2023-08-28: qty 30, 30d supply, fill #0

## 2023-08-28 NOTE — Progress Notes (Signed)
   Follow-Up Visit   Subjective  Richard Dominguez is a 28 y.o. male who presents for the following: acne  Patient present today for follow up visit. Patient was last evaluated on 05/28/23. Patient reports sxs are unchanged as he completed the Doxycycline and he stated that he did not use the [place]. He stated he D/C as he did not desire to purchase anymore. Patient denies medication changes.  The following portions of the chart were reviewed this encounter and updated as appropriate: medications, allergies, medical history  Review of Systems:  No other skin or systemic complaints except as noted in HPI or Assessment and Plan.  Objective  Well appearing patient in no apparent distress; mood and affect are within normal limits.   A focused examination was performed of the following areas: face   Relevant exam findings are noted in the Assessment and Plan.    Assessment & Plan   Acne Vulgaris Assessment: Patient previously treated with clindamycin (topical) and doxycycline (oral) for acne, showing improvement but discontinued medications. Current examination shows a few small acne lesions, initially described as mild.  Plan: Resume doxycycline 100mg  daily with food for 3 months. Provide samples of CeraVe Acne Foaming Face Wash (benzoyl peroxide). Schedule follow-up appointment for July. If progress is unsatisfactory at follow-up, consider adding topical tretinoin at night.  Post-surgical Scar (Epidermoid Cyst Excision) Assessment: Patient underwent epidermoid cyst removal, confirmed by pathology. Reports the surgical site feels "odd". Examination reveals a slightly widened, atrophic scar with a slight divot, likely due to high tension in the upper back area.  Plan: No immediate intervention required. Offer topical treatment option if scar appearance becomes bothersome to the patient.     No follow-ups on file.    Documentation: I have reviewed the above documentation for accuracy  and completeness, and I agree with the above.   I, Shirron Marcha Solders, CMA, am acting as scribe for [employer], DO.   Langston Reusing, DO

## 2023-08-28 NOTE — Patient Instructions (Signed)
Thank you for visiting [place] today. Here is a summary of the essential instructions from today's consultation regarding your acne treatment plan:  Continue using Doxycycline: Ensure to take it with food to mitigate any stomach upset. A 90-day supply has been prescribed, with 3 refills available.  Start using CeraVe Acne Foaming Face Wash: This product contains benzoyl peroxide, which is effective in reducing bacteria. We will provide samples for initial use. Should you find it suitable, it is available for purchase at retailers such as [employer] or [employer].  Moisturizing: To prevent dryness, it is recommended to apply a moisturizer after washing your face. The CeraVe brand is noted for being generally less drying.  Follow-up Appointment: We have scheduled a follow-up in July to evaluate your progress and discuss any necessary adjustments to your treatment plan.  Please do not hesitate to contact our office if you have any questions or concerns regarding your treatment plan.  Warm regards,  Dr. Langston [facility]   Important Information  Due to recent changes in healthcare laws, you may see results of your pathology and/or laboratory studies on MyChart before the doctors have had a chance to review them. We understand that in some cases there may be results that are confusing or concerning to you. Please understand that not all results are received at the same time and often the doctors may need to interpret multiple results in order to provide you with the best plan of care or course of treatment. Therefore, we ask that you please give [place] 2 business days to thoroughly review all your results before contacting the office for clarification. Should we see a critical lab result, you will be contacted sooner.   If You Need Anything After Your Visit  If you have any questions or concerns for your doctor, please call our main line at [phone] If no one answers, please leave a voicemail as  directed and we will return your call as soon as possible. Messages left after 4 pm will be answered the following business day.   You may also send [place] a message via MyChart. We typically respond to MyChart messages within 1-2 business days.  For prescription refills, please ask your pharmacy to contact our office. Our fax number is [phone].  If you have an urgent issue when the clinic is closed that cannot wait until the next business day, you can page your doctor at the number below.    Please note that while we do our best to be available for urgent issues outside of office hours, we are not available 24/7.   If you have an urgent issue and are unable to reach [place], you may choose to seek medical care at your doctor's office, retail clinic, urgent care center, or emergency room.  If you have a medical emergency, please immediately call 911 or go to the emergency department. In the event of inclement weather, please call our main line at [phone] for an update on the status of any delays or closures.  Dermatology Medication Tips: Please keep the boxes that topical medications come in in order to help keep track of the instructions about where and how to use these. Pharmacies typically print the medication instructions only on the boxes and not directly on the medication tubes.   If your medication is too expensive, please contact our office at [phone] or send [place] a message through MyChart.   We are unable to tell what your co-pay for medications will be in advance  as this is different depending on your insurance coverage. However, we may be able to find a substitute medication at lower cost or fill out paperwork to get insurance to cover a needed medication.   If a prior authorization is required to get your medication covered by your insurance company, please allow [place] 1-2 business days to complete this process.  Drug prices often vary depending on where the prescription is  filled and some pharmacies may offer cheaper prices.  The website www.goodrx.com contains coupons for medications through different pharmacies. The prices here do not account for what the cost may be with help from insurance (it may be cheaper with your insurance), but the website can give you the price if you did not use any insurance.  - You can print the associated coupon and take it with your prescription to the pharmacy.  - You may also stop by our office during regular business hours and pick up a GoodRx coupon card.  - If you need your prescription sent electronically to a different pharmacy, notify our office through [facility] or by phone at [phone]

## 2023-09-03 DIAGNOSIS — F320 Major depressive disorder, single episode, mild: Secondary | ICD-10-CM | POA: Diagnosis not present

## 2023-10-10 DIAGNOSIS — F320 Major depressive disorder, single episode, mild: Secondary | ICD-10-CM | POA: Diagnosis not present

## 2023-10-21 ENCOUNTER — Other Ambulatory Visit: Payer: Self-pay

## 2023-10-21 ENCOUNTER — Other Ambulatory Visit (HOSPITAL_COMMUNITY): Payer: Self-pay

## 2023-11-13 ENCOUNTER — Other Ambulatory Visit (HOSPITAL_COMMUNITY): Payer: Self-pay

## 2023-11-27 DIAGNOSIS — F320 Major depressive disorder, single episode, mild: Secondary | ICD-10-CM | POA: Diagnosis not present

## 2023-12-03 DIAGNOSIS — F320 Major depressive disorder, single episode, mild: Secondary | ICD-10-CM | POA: Diagnosis not present

## 2023-12-03 DIAGNOSIS — Z882 Allergy status to sulfonamides status: Secondary | ICD-10-CM | POA: Diagnosis not present

## 2023-12-03 DIAGNOSIS — K219 Gastro-esophageal reflux disease without esophagitis: Secondary | ICD-10-CM | POA: Diagnosis not present

## 2024-01-20 DIAGNOSIS — F320 Major depressive disorder, single episode, mild: Secondary | ICD-10-CM | POA: Diagnosis not present

## 2024-01-23 ENCOUNTER — Other Ambulatory Visit (HOSPITAL_COMMUNITY): Payer: Self-pay

## 2024-01-23 ENCOUNTER — Encounter: Payer: Self-pay | Admitting: Dermatology

## 2024-01-23 ENCOUNTER — Ambulatory Visit: Payer: Self-pay | Admitting: Dermatology

## 2024-01-23 VITALS — BP 106/70 | HR 75

## 2024-01-23 DIAGNOSIS — L700 Acne vulgaris: Secondary | ICD-10-CM

## 2024-01-23 MED ORDER — DOXYCYCLINE HYCLATE 100 MG PO CAPS
100.0000 mg | ORAL_CAPSULE | Freq: Every day | ORAL | 0 refills | Status: AC
  Filled 2024-01-23: qty 30, 30d supply, fill #0
  Filled 2024-07-13: qty 30, 30d supply, fill #1

## 2024-01-23 MED ORDER — TRETINOIN 0.05 % EX CREA
1.0000 | TOPICAL_CREAM | CUTANEOUS | 3 refills | Status: AC
  Filled 2024-01-23: qty 45, 30d supply, fill #0

## 2024-01-23 NOTE — Patient Instructions (Addendum)
 Date: Thu Jan 23 2024  Hello Richard Dominguez,  Thank you for visiting today. Here is a summary of the key instructions:  - Medications: Take doxycycline  100 mg once a day with dinner   - Use tretinoin cream at night:   - Apply a pea-sized amount   - Start with 2 nights a week, then increase to 3 nights a week if doing well   - Put moisturizer on top if you feel stinging or burning   - Keep the cream on the sink, not in a drawer or cabinet  - Skin Care: Continue washing your face with your current soap  - Follow-up: Return for a follow-up appointment in 6 months  - Additional Instructions:   - If you have questions, send a message through MyChart   - If this treatment doesn't work or you can't stick to the routine, consider discussing Accutane as an option  Please reach out if you have any questions or concerns.  Warm regards,  Dr. [facility]     Important Information   Due to recent changes in healthcare laws, you may see results of your pathology and/or laboratory studies on MyChart before the doctors have had a chance to review them. We understand that in some cases there may be results that are confusing or concerning to you. Please understand that not all results are received at the same time and often the doctors may need to interpret multiple results in order to provide you with the best plan of care or course of treatment. Therefore, we ask that you please give us  2 business days to thoroughly review all your results before contacting the office for clarification. Should we see a critical lab result, you will be contacted sooner.     If You Need Anything After Your Visit   If you have any questions or concerns for your doctor, please call our main line at [phone]. If no one answers, please leave a voicemail as directed and we will return your call as soon as possible. Messages left after 4 pm will be answered the following business day.    You may also  send us  a message via MyChart. We typically respond to MyChart messages within 1-2 business days.  For prescription refills, please ask your pharmacy to contact our office. Our fax number is [phone].  If you have an urgent issue when the clinic is closed that cannot wait until the next business day, you can page your doctor at the number below.     Please note that while we do our best to be available for urgent issues outside of office hours, we are not available 24/7.    If you have an urgent issue and are unable to reach us , you may choose to seek medical care at your doctor's office, retail clinic, urgent care center, or emergency room.   If you have a medical emergency, please immediately call 911 or go to the emergency department. In the event of inclement weather, please call our main line at [phone] for an update on the status of any delays or closures.  Dermatology Medication Tips: Please keep the boxes that topical medications come in in order to help keep track of the instructions about where and how to use these. Pharmacies typically print the medication instructions only on the boxes and not directly on the medication tubes.   If your medication is too expensive, please contact our office at [phone] or send us  a  message through MyChart.    We are unable to tell what your co-pay for medications will be in advance as this is different depending on your insurance coverage. However, we may be able to find a substitute medication at lower cost or fill out paperwork to get insurance to cover a needed medication.    If a prior authorization is required to get your medication covered by your insurance company, please allow us  1-2 business days to complete this process.   Drug prices often vary depending on where the prescription is filled and some pharmacies may offer cheaper prices.   The website www.goodrx.com contains coupons for medications through different  pharmacies. The prices here do not account for what the cost may be with help from insurance (it may be cheaper with your insurance), but the website can give you the price if you did not use any insurance.  - You can print the associated coupon and take it with your prescription to the pharmacy.  - You may also stop by our office during regular business hours and pick up a GoodRx coupon card.  - If you need your prescription sent electronically to a different pharmacy, notify our office through [facility] or by phone at [phone]

## 2024-01-23 NOTE — Progress Notes (Signed)
   Follow-Up Visit   Subjective  Richard Dominguez is a 28 y.o. male who presents for the following: acne Pt was last seen 08/28/23 for acne. Pt uses aussie soap to wash his face. He is not currently taking the Doxy or using any topicals. Pt feels like acne has improved since he initially came in.    The following portions of the chart were reviewed this encounter and updated as appropriate: medications, allergies, medical history  Review of Systems:  No other skin or systemic complaints except as noted in HPI or Assessment and Plan.  Objective  Well appearing patient in no apparent distress; mood and affect are within normal limits.   A focused examination was performed of the following areas: face  Relevant exam findings are noted in the Assessment and Plan.           Assessment & Plan   ACNE VULGARIS Exam: Open comedones and inflammatory papules  Chronic and persistent condition with duration or expected duration over one year. Condition is symptomatic/ bothersome to patient. Not currently at goal.   1. Acne - Assessment: Patient has not been adherent to previously recommended acne treatment regimen. The acne does not currently bother the patient, contributing to lack of motivation for treatment. Previous simplified regimen of doxycycline  and Panoxyl (benzoyl peroxide  wash) was not implemented. Patient is currently using regular soap (Aussie or Old Spice) for face washing, which is not specifically targeting acne-causing bacteria.  - Plan:    Prescribe doxycycline  100 mg PO daily with dinner     - Informed patient about potential upset stomach if not taken with food    Prescribe tretinoin  0.05% topical cream     - Instructions: Apply pea-sized amount to face at night, starting with 2 nights per week, increasing to 3 nights per week if tolerated     - Advised to apply moisturizer on top if experiencing stinging or burning     - Recommended keeping the topical on the sink for  visibility and adherence    Dispense 90-day supply of doxycycline  to minimize pharmacy visits    Discussed potential for Accutane (isotretinoin) as future option if current regimen is ineffective or adherence remains an issue     - Informed patient about Accutane's mechanism of action, duration of treatment, and requirement for monthly visits    No follow-ups on file.  LILLETTE Darice Smock, CMA, am acting as scribe for [employer], DO.   Documentation: I have reviewed the above documentation for accuracy and completeness, and I agree with the above.  Delon Lenis, DO

## 2024-02-10 ENCOUNTER — Encounter (INDEPENDENT_AMBULATORY_CARE_PROVIDER_SITE_OTHER): Payer: Self-pay | Admitting: Otolaryngology

## 2024-02-10 ENCOUNTER — Ambulatory Visit (INDEPENDENT_AMBULATORY_CARE_PROVIDER_SITE_OTHER): Payer: Commercial Managed Care - PPO | Admitting: Otolaryngology

## 2024-02-10 VITALS — BP 121/78 | HR 66

## 2024-02-10 DIAGNOSIS — H6123 Impacted cerumen, bilateral: Secondary | ICD-10-CM | POA: Diagnosis not present

## 2024-02-10 NOTE — Progress Notes (Signed)
 Patient ID: Richard Dominguez, male   DOB: September 24, 1995, 28 y.o.   MRN: [id number]  Procedure: Bilateral cerumen disimpaction.   Indication: Cerumen impaction, resulting in ear discomfort and conductive hearing loss.   Description: The patient is placed supine on the operating table. Under the operating microscope, the right ear canal is examined and is noted to be impacted with cerumen. The cerumen is carefully removed with a combination of suction catheters, cerumen curette, and alligator forceps. After the cerumen removal, the ear canal and tympanic membrane are noted to be normal. No middle ear effusion is noted. The same procedure is then repeated on the left side without exception. The patient tolerated the procedure well.  Follow-up care:  The patient is instructed not to use Q-tips to clean the ear canals. The patient will follow up in 9 months.

## 2024-02-24 ENCOUNTER — Other Ambulatory Visit: Payer: Self-pay

## 2024-02-24 ENCOUNTER — Other Ambulatory Visit (HOSPITAL_COMMUNITY): Payer: Self-pay

## 2024-02-25 ENCOUNTER — Other Ambulatory Visit (HOSPITAL_COMMUNITY): Payer: Self-pay

## 2024-02-25 DIAGNOSIS — F411 Generalized anxiety disorder: Secondary | ICD-10-CM | POA: Diagnosis not present

## 2024-02-25 DIAGNOSIS — L709 Acne, unspecified: Secondary | ICD-10-CM | POA: Diagnosis not present

## 2024-02-25 DIAGNOSIS — E782 Mixed hyperlipidemia: Secondary | ICD-10-CM | POA: Diagnosis not present

## 2024-02-25 DIAGNOSIS — Z6825 Body mass index (BMI) 25.0-25.9, adult: Secondary | ICD-10-CM | POA: Diagnosis not present

## 2024-02-25 DIAGNOSIS — F3341 Major depressive disorder, recurrent, in partial remission: Secondary | ICD-10-CM | POA: Diagnosis not present

## 2024-02-25 DIAGNOSIS — Z111 Encounter for screening for respiratory tuberculosis: Secondary | ICD-10-CM | POA: Diagnosis not present

## 2024-02-25 DIAGNOSIS — Z0000 Encounter for general adult medical examination without abnormal findings: Secondary | ICD-10-CM | POA: Diagnosis not present

## 2024-02-25 MED ORDER — BUPROPION HCL ER (XL) 300 MG PO TB24
300.0000 mg | ORAL_TABLET | ORAL | 3 refills | Status: AC
  Filled 2024-02-25: qty 30, 30d supply, fill #0
  Filled 2024-07-13: qty 90, 90d supply, fill #0

## 2024-02-25 MED ORDER — ESCITALOPRAM OXALATE 20 MG PO TABS
20.0000 mg | ORAL_TABLET | Freq: Every day | ORAL | 3 refills | Status: AC
  Filled 2024-02-25: qty 90, 90d supply, fill #0
  Filled 2024-03-25: qty 30, 30d supply, fill #0
  Filled 2024-07-13: qty 90, 90d supply, fill #1

## 2024-03-25 ENCOUNTER — Other Ambulatory Visit (HOSPITAL_COMMUNITY): Payer: Self-pay

## 2024-03-31 DIAGNOSIS — Z111 Encounter for screening for respiratory tuberculosis: Secondary | ICD-10-CM | POA: Diagnosis not present

## 2024-06-10 ENCOUNTER — Other Ambulatory Visit (HOSPITAL_COMMUNITY): Payer: Self-pay

## 2024-06-15 DIAGNOSIS — F320 Major depressive disorder, single episode, mild: Secondary | ICD-10-CM | POA: Diagnosis not present

## 2024-06-29 DIAGNOSIS — F320 Major depressive disorder, single episode, mild: Secondary | ICD-10-CM | POA: Diagnosis not present

## 2024-07-06 ENCOUNTER — Ambulatory Visit: Admitting: Dermatology

## 2024-07-13 ENCOUNTER — Other Ambulatory Visit (HOSPITAL_COMMUNITY): Payer: Self-pay

## 2024-08-25 ENCOUNTER — Encounter: Payer: Self-pay | Admitting: Family Medicine

## 2024-08-25 DIAGNOSIS — F3340 Major depressive disorder, recurrent, in remission, unspecified: Secondary | ICD-10-CM | POA: Insufficient documentation

## 2024-08-25 DIAGNOSIS — L709 Acne, unspecified: Secondary | ICD-10-CM | POA: Insufficient documentation

## 2024-08-25 DIAGNOSIS — E782 Mixed hyperlipidemia: Secondary | ICD-10-CM | POA: Insufficient documentation

## 2024-08-25 DIAGNOSIS — F411 Generalized anxiety disorder: Secondary | ICD-10-CM | POA: Insufficient documentation

## 2024-08-25 NOTE — Progress Notes (Unsigned)
 "     Office Note 08/26/2024  CC:  Chief Complaint  Patient presents with   Establish Care   HPI:  Richard Dominguez is a 29 y.o. male who is here to establish care, follow-up depression. Patient's most recent primary MD: Jodie Batch, FNP [facility]. Old records in EPIC/[facility] EMR were reviewed prior to or during today's visit.  Most recent cpe 01/2024.  Had to switch to an in-network PCP after insurance changed recently. He has been stable on Wellbutrin  XL 300 mg a day and Lexapro  20 mg a day.  He sees a veterinary surgeon. No problems with the medications.  Denies any problems with chronic anxiety or panic attacks.  ROS --> no fevers, no CP, no SOB, no wheezing, no cough, no dizziness, no HAs, no rashes, no melena/hematochezia.  No polyuria or polydipsia.  No myalgias or arthralgias.  No focal weakness, paresthesias, or tremors.  No acute vision or hearing abnormalities.  No dysuria or unusual/new urinary urgency or frequency.  No recent changes in lower legs. No n/v/d or abd pain.  No palpitations.    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   Chronic sinusitis 06/2017   MDD (major depressive disorder)     Past Surgical History:  Procedure Laterality Date   CYST EXCISION     lower lip   SINUS ENDO W/FUSION Bilateral 08/02/2017   Procedure: Endoscopic sinus surgery with maxillary antrostomy with stripping and anterior ethmoidectomy;  Surgeon: Roark Rush, MD;  Location: [facility];  Service: ENT;  Laterality: Bilateral;  Endoscopic sinus surgery with maxillary antrostomy with stripping and anterior ethmoidectomy   TYMPANOSTOMY TUBE PLACEMENT Bilateral    WISDOM TOOTH EXTRACTION      Family History  Problem Relation Age of Onset   Hyperlipidemia Mother    Anesthesia problems Father        wakes up angry   Hyperlipidemia Father    Mental illness Maternal Grandmother    Alcohol abuse Maternal Grandmother    Heart  disease Maternal Grandfather    Arthritis Paternal Grandmother     Social History   Socioeconomic History   Marital status: Single    Spouse name: Not on file   Number of children: Not on file   Years of education: Not on file   Highest education level: Not on file  Occupational History   Not on file  Tobacco Use   Smoking status: Never    Passive exposure: Never   Smokeless tobacco: Never  Vaping Use   Vaping status: Never Used  Substance and Sexual Activity   Alcohol use: Yes    Comment: occasionally   Drug use: No   Sexual activity: Not Currently    Partners: Female  Other Topics Concern   Not on file  Social History Narrative   Single.   Has 1 brother.   Educ: Associates degree, studying at [employer] as of 07/2024.   Alcohol: Social   Tobacco: Non-smoker   Social Drivers of Health   Tobacco Use: Low Risk (08/26/2024)   Patient History    Smoking Tobacco Use: Never    Smokeless Tobacco Use: Never    Passive Exposure: Never  Financial Resource Strain: Not on file  Food Insecurity: Not on file  Transportation Needs: Not on file  Physical Activity: Not on file  Stress: Not on file  Social Connections: Not on file  Intimate  Partner Violence: Not on file  Depression (PHQ2-9): Low Risk (08/26/2024)   Depression (PHQ2-9)    PHQ-2 Score: 4  Alcohol Screen: Not on file  Housing: Not on file  Utilities: Not on file  Health Literacy: Not on file    Outpatient Encounter Medications as of 08/26/2024  Medication Sig   buPROPion  (WELLBUTRIN  XL) 300 MG 24 hr tablet Take 1 tablet (300 mg total) by mouth every morning.   doxycycline  (VIBRAMYCIN ) 100 MG capsule Take 100 mg by mouth daily.   escitalopram  (LEXAPRO ) 20 MG tablet Take 1 tablet (20 mg total) by mouth daily.   tretinoin  (RETIN-A ) 0.05 % cream Apply to face 2 nights a week, increasing to 3 nights (mixed with moisturizer).   [DISCONTINUED] buPROPion  (WELLBUTRIN  XL) 300 MG 24 hr tablet Take 1 tablet (300 mg total) by  mouth every morning.   [DISCONTINUED] escitalopram  (LEXAPRO ) 20 MG tablet Take 1 tablet (20 mg total) by mouth daily.   [DISCONTINUED] escitalopram  (LEXAPRO ) 20 MG tablet Take 1 tablet (20 mg total) by mouth daily.   No facility-administered encounter medications on file as of 08/26/2024.    Allergies[1]   PE; Blood pressure 113/73, pulse 79, temperature 98.5 F (36.9 C), temperature source Temporal, height 6' 4.5 (1.943 m), weight 202 lb 9.6 oz (91.9 kg), SpO2 97%. Body mass index is 24.34 kg/m.  Physical Exam  Gen: Alert, well appearing.  Patient is oriented to person, place, time, and situation. AFFECT: pleasant, lucid thought and speech. ZWU:Zbzd: no injection, icteris, swelling, or exudate.  EOMI, PERRLA. Mouth: lips without lesion/swelling.  Oral mucosa pink and moist. Oropharynx without erythema, exudate, or swelling.  CV: RRR, no m/r/g.   LUNGS: CTA bilat, nonlabored resps, good aeration in all lung fields.  Pertinent labs:  none  ASSESSMENT AND PLAN:   New patient, establishing care.  Major depressive disorder, in remission on Wellbutrin  XL 300 mg a day and Lexapro  20 mg a day.  He is also getting great benefit from counseling. Continue current management.  An After Visit Summary was printed and given to the patient.  Return in about 6 months (around 02/23/2025) for annual CPE (fasting).  Signed:  Gerlene Hockey, MD           08/26/2024      [1]  Allergies Allergen Reactions   Sulfa Antibiotics Rash   Sulfasalazine Rash   "

## 2024-08-26 ENCOUNTER — Other Ambulatory Visit: Payer: Self-pay

## 2024-08-26 ENCOUNTER — Ambulatory Visit: Admitting: Family Medicine

## 2024-08-26 VITALS — BP 113/73 | HR 79 | Temp 98.5°F | Ht 76.5 in | Wt 202.6 lb

## 2024-08-26 DIAGNOSIS — F3341 Major depressive disorder, recurrent, in partial remission: Secondary | ICD-10-CM | POA: Diagnosis not present
